# Patient Record
Sex: Male | Born: 1960 | Race: White | Hispanic: No | Marital: Married | Smoking: Never smoker
Health system: Southern US, Community
[De-identification: ages and names within clinical notes are randomized; demographics above are authoritative.]

## PROBLEM LIST (undated history)

## (undated) DIAGNOSIS — G4730 Sleep apnea, unspecified: Secondary | ICD-10-CM

## (undated) DIAGNOSIS — M1990 Unspecified osteoarthritis, unspecified site: Secondary | ICD-10-CM

## (undated) DIAGNOSIS — F32A Depression, unspecified: Secondary | ICD-10-CM

## (undated) DIAGNOSIS — C61 Malignant neoplasm of prostate: Secondary | ICD-10-CM

## (undated) DIAGNOSIS — N189 Chronic kidney disease, unspecified: Secondary | ICD-10-CM

## (undated) DIAGNOSIS — Z87442 Personal history of urinary calculi: Secondary | ICD-10-CM

## (undated) DIAGNOSIS — I4510 Unspecified right bundle-branch block: Secondary | ICD-10-CM

## (undated) DIAGNOSIS — K589 Irritable bowel syndrome without diarrhea: Secondary | ICD-10-CM

## (undated) DIAGNOSIS — F329 Major depressive disorder, single episode, unspecified: Secondary | ICD-10-CM

## (undated) DIAGNOSIS — I10 Essential (primary) hypertension: Secondary | ICD-10-CM

## (undated) DIAGNOSIS — K219 Gastro-esophageal reflux disease without esophagitis: Secondary | ICD-10-CM

## (undated) HISTORY — DX: Major depressive disorder, single episode, unspecified: F32.9

## (undated) HISTORY — DX: Chronic kidney disease, unspecified: N18.9

## (undated) HISTORY — DX: Unspecified right bundle-branch block: I45.10

## (undated) HISTORY — PX: SHOULDER ARTHROSCOPY: SHX128

## (undated) HISTORY — PX: APPENDECTOMY: SHX54

## (undated) HISTORY — PX: HERNIA REPAIR: SHX51

## (undated) HISTORY — PX: WISDOM TOOTH EXTRACTION: SHX21

## (undated) HISTORY — DX: Malignant neoplasm of prostate: C61

## (undated) HISTORY — DX: Depression, unspecified: F32.A

## (undated) HISTORY — DX: Essential (primary) hypertension: I10

---

## 1994-04-10 HISTORY — PX: INGUINAL HERNIA REPAIR: SUR1180

## 2011-04-11 HISTORY — PX: COLONOSCOPY: SHX174

## 2011-12-26 ENCOUNTER — Encounter: Payer: Self-pay | Admitting: Internal Medicine

## 2012-01-25 ENCOUNTER — Encounter: Payer: Self-pay | Admitting: Internal Medicine

## 2012-01-25 ENCOUNTER — Ambulatory Visit (AMBULATORY_SURGERY_CENTER): Payer: BC Managed Care – PPO | Admitting: *Deleted

## 2012-01-25 VITALS — Ht 73.0 in | Wt 241.2 lb

## 2012-01-25 DIAGNOSIS — Z1211 Encounter for screening for malignant neoplasm of colon: Secondary | ICD-10-CM

## 2012-01-25 MED ORDER — MOVIPREP 100 G PO SOLR: 1.0000 | Freq: Once | ORAL | Status: DC

## 2012-01-25 NOTE — Progress Notes (Signed)
Pt had a previous colonoscopy in [place] in 2004 that was normal except internal hemorrhoids. ewm

## 2012-02-15 ENCOUNTER — Encounter: Payer: Self-pay | Admitting: Internal Medicine

## 2012-02-15 ENCOUNTER — Ambulatory Visit (AMBULATORY_SURGERY_CENTER): Payer: BC Managed Care – PPO | Admitting: Internal Medicine

## 2012-02-15 VITALS — BP 117/70 | HR 66 | Temp 98.5°F | Resp 20 | Ht 73.0 in | Wt 241.0 lb

## 2012-02-15 DIAGNOSIS — Z1211 Encounter for screening for malignant neoplasm of colon: Secondary | ICD-10-CM

## 2012-02-15 MED ORDER — SODIUM CHLORIDE 0.9 % IV SOLN: 500.0000 mL | INTRAVENOUS | Status: DC

## 2012-02-15 NOTE — Op Note (Signed)
[facility] [address] [place], [address]   COLONOSCOPY PROCEDURE REPORT  PATIENT: [name], Brandon Franco  MR#: [id number] BIRTHDATE: 04/18/1960 , 51  yrs. old GENDER: Male ENDOSCOPIST: Roxy Cedar, MD REFERRED XB:[id number] Tisovec, M.D. PROCEDURE DATE:  02/15/2012 PROCEDURE:   Colonoscopy, screening    (negative colon in [place] (reviewed) to evaluate IBS type symptoms) ASA CLASS:   Class II INDICATIONS:average risk patient for colon cancer. MEDICATIONS: MAC sedation, administered by CRNA and propofol (Diprivan) 300mg  IV  DESCRIPTION OF PROCEDURE:   After the risks benefits and alternatives of the procedure were thoroughly explained, informed consent was obtained.  A digital rectal exam revealed no abnormalities of the rectum.   The LB CF-[id number]  endoscope was introduced through the anus and advanced to the cecum, which was identified by both the appendix and ileocecal valve. No adverse events experienced.   The quality of the prep was excellent, using MoviPrep  The instrument was then slowly withdrawn as the colon was fully examined.      COLON FINDINGS: Mild diverticulosis was noted in the sigmoid colon. Small nonbleeding cecal AVM.   The colon was otherwise normal. There was no  inflammation, polyps or cancers .  Retroflexed views revealed no abnormalities. The time to cecum=2 minutes 56 seconds. Withdrawal time=11 minutes 30 seconds.  The scope was withdrawn and the procedure completed. COMPLICATIONS: There were no complications.  ENDOSCOPIC IMPRESSION: 1.   Mild diverticulosis was noted in the sigmoid colon and incidental small cecal AVM 2.   The colon was otherwise normal... No polyps  RECOMMENDATIONS: 1. Continue current colorectal screening recommendations for "routine risk" patients with a repeat colonoscopy in 10 years.   eSigned:  Roxy Cedar, MD 02/15/2012 9:47 AM   cc: Guerry Bruin, MD and The Patient

## 2012-02-15 NOTE — Patient Instructions (Addendum)

## 2012-02-15 NOTE — Progress Notes (Signed)
Patient did not experience any of the following events: a burn prior to discharge; a fall within the facility; wrong site/side/patient/procedure/implant event; or a hospital transfer or hospital admission upon discharge from the facility. (G8907) Patient did not have preoperative order for IV antibiotic SSI prophylaxis. (G8918)  

## 2012-02-16 ENCOUNTER — Telehealth: Payer: Self-pay | Admitting: *Deleted

## 2012-02-16 NOTE — Telephone Encounter (Signed)
  Follow up Call-  Call back number 02/15/2012  Post procedure Call Back phone  # [phone]  Permission to leave phone message Yes     Patient questions:  Do you have a fever, pain , or abdominal swelling? no Pain Score  0 *  Have you tolerated food without any problems? yes  Have you been able to return to your normal activities? yes  Do you have any questions about your discharge instructions: Diet   no Medications  no Follow up visit  no  Do you have questions or concerns about your Care? no  Actions:2 * If pain score is 4 or above: No action needed, pain <4.

## 2012-04-11 ENCOUNTER — Other Ambulatory Visit (HOSPITAL_COMMUNITY): Payer: Self-pay | Admitting: Orthopaedic Surgery

## 2012-04-19 ENCOUNTER — Encounter (HOSPITAL_COMMUNITY): Payer: Self-pay | Admitting: Pharmacy Technician

## 2012-04-22 ENCOUNTER — Ambulatory Visit (HOSPITAL_COMMUNITY)
Admission: RE | Admit: 2012-04-22 | Discharge: 2012-04-22 | Disposition: A | Discharge status: Home / Self Care | Payer: BC Managed Care – PPO | Source: Ambulatory Visit | Attending: Orthopaedic Surgery | Admitting: Orthopaedic Surgery

## 2012-04-22 ENCOUNTER — Encounter (HOSPITAL_COMMUNITY)
Admission: RE | Admit: 2012-04-22 | Discharge: 2012-04-22 | Disposition: A | Discharge status: Home / Self Care | Payer: BC Managed Care – PPO | Source: Ambulatory Visit | Attending: Orthopaedic Surgery | Admitting: Orthopaedic Surgery

## 2012-04-22 ENCOUNTER — Encounter (HOSPITAL_COMMUNITY): Payer: Self-pay

## 2012-04-22 DIAGNOSIS — I4510 Unspecified right bundle-branch block: Secondary | ICD-10-CM | POA: Insufficient documentation

## 2012-04-22 DIAGNOSIS — Z01818 Encounter for other preprocedural examination: Secondary | ICD-10-CM | POA: Insufficient documentation

## 2012-04-22 DIAGNOSIS — Z01810 Encounter for preprocedural cardiovascular examination: Secondary | ICD-10-CM | POA: Insufficient documentation

## 2012-04-22 DIAGNOSIS — Z01812 Encounter for preprocedural laboratory examination: Secondary | ICD-10-CM | POA: Insufficient documentation

## 2012-04-22 DIAGNOSIS — R9431 Abnormal electrocardiogram [ECG] [EKG]: Secondary | ICD-10-CM | POA: Insufficient documentation

## 2012-04-22 HISTORY — DX: Personal history of urinary calculi: Z87.442

## 2012-04-22 HISTORY — DX: Irritable bowel syndrome without diarrhea: K58.9

## 2012-04-22 HISTORY — DX: Unspecified osteoarthritis, unspecified site: M19.90

## 2012-04-22 LAB — URINALYSIS, ROUTINE W REFLEX MICROSCOPIC
Bilirubin Urine: NEGATIVE
Glucose, UA: NEGATIVE mg/dL
Ketones, ur: NEGATIVE mg/dL
pH: 6 (ref 5.0–8.0)

## 2012-04-22 LAB — CBC
Hemoglobin: 16.5 g/dL (ref 13.0–17.0)
MCH: 31.4 pg (ref 26.0–34.0)
MCV: 88.4 fL (ref 78.0–100.0)
RBC: 5.25 MIL/uL (ref 4.22–5.81)

## 2012-04-22 LAB — SURGICAL PCR SCREEN: MRSA, PCR: NEGATIVE

## 2012-04-22 LAB — BASIC METABOLIC PANEL
CO2: 28 mEq/L (ref 19–32)
Calcium: 10 mg/dL (ref 8.4–10.5)
Glucose, Bld: 90 mg/dL (ref 70–99)
Potassium: 3.9 mEq/L (ref 3.5–5.1)
Sodium: 137 mEq/L (ref 135–145)

## 2012-04-22 LAB — PROTIME-INR
INR: 0.92 (ref 0.00–1.49)
Prothrombin Time: 12.3 seconds (ref 11.6–15.2)

## 2012-04-22 NOTE — Patient Instructions (Addendum)
Brandon Franco  04/22/2012                           YOUR PROCEDURE IS SCHEDULED ON:  04/26/12                PLEASE REPORT TO SHORT STAY CENTER AT :  12:15 pm               CALL THIS NUMBER IF ANY PROBLEMS THE DAY OF SURGERY :               [phone]                      REMEMBER:   Do not eat food or drink liquids AFTER MIDNIGHT  May have clear liquids UNTIL 6 HOURS BEFORE SURGERY 8:45 AM  Clear liquids include soda, tea, black coffee, apple or grape juice, broth.  Take these medicines the morning of surgery with A SIP OF WATER: PAXIL   Do not wear jewelry, make-up   Do not wear lotions, powders, or perfumes.   Do not shave legs or underarms 12 hrs. before surgery (men may shave face)  Do not bring valuables to the hospital.  Contacts, dentures or bridgework may not be worn into surgery.  Leave suitcase in the car. After surgery it may be brought to your room.  For patients admitted to the hospital more than one night, checkout time is 11:00                          The day of discharge.   Patients discharged the day of surgery will not be allowed to drive home                             If going home same day of surgery, must have someone stay with you first                           24 hrs at home and arrange for some one to drive you home from hospital.    Special Instructions:   Please read over the following fact sheets that you were given:               1. MRSA  INFORMATION                      2. [place] PREPARING FOR SURGERY SHEET                                                X_____________________________________________________________________        Failure to follow these instructions may result in cancellation of your surgery

## 2012-04-22 NOTE — Progress Notes (Signed)
Abnormal PTT faxed to Dr. Maureen Ralphs - confirmation recieved

## 2012-04-22 NOTE — Progress Notes (Signed)
04/22/12 0816  OBSTRUCTIVE SLEEP APNEA  Do you snore loudly (loud enough to be heard through closed doors)?  1  Do you often feel tired, fatigued, or sleepy during the daytime? 0  Has anyone observed you stop breathing during your sleep? 0  Do you have, or are you being treated for high blood pressure? 1  BMI more than 35 kg/m2? 0  Age over 51 years old? 1  Neck circumference greater than 40 cm/18 inches? 0  Gender: 1  Obstructive Sleep Apnea Score 4   Score 4 or greater  Results sent to PCP

## 2012-04-26 ENCOUNTER — Inpatient Hospital Stay (HOSPITAL_COMMUNITY)
Admission: RE | Admit: 2012-04-26 | Discharge: 2012-04-29 | DRG: 818 | Disposition: A | Discharge status: Home / Self Care | Payer: BC Managed Care – PPO | Source: Ambulatory Visit | Attending: Orthopaedic Surgery | Admitting: Orthopaedic Surgery

## 2012-04-26 ENCOUNTER — Encounter (HOSPITAL_COMMUNITY)
Admission: RE | Disposition: A | Discharge status: Home / Self Care | Payer: Self-pay | Source: Ambulatory Visit | Attending: Orthopaedic Surgery

## 2012-04-26 ENCOUNTER — Ambulatory Visit (HOSPITAL_COMMUNITY): Payer: BC Managed Care – PPO | Admitting: Anesthesiology

## 2012-04-26 ENCOUNTER — Ambulatory Visit (HOSPITAL_COMMUNITY): Payer: BC Managed Care – PPO

## 2012-04-26 ENCOUNTER — Encounter (HOSPITAL_COMMUNITY): Payer: Self-pay | Admitting: Orthopedic Surgery

## 2012-04-26 ENCOUNTER — Encounter (HOSPITAL_COMMUNITY): Payer: Self-pay | Admitting: Anesthesiology

## 2012-04-26 ENCOUNTER — Encounter (HOSPITAL_COMMUNITY): Payer: Self-pay | Admitting: *Deleted

## 2012-04-26 DIAGNOSIS — R29898 Other symptoms and signs involving the musculoskeletal system: Secondary | ICD-10-CM | POA: Diagnosis present

## 2012-04-26 DIAGNOSIS — F329 Major depressive disorder, single episode, unspecified: Secondary | ICD-10-CM | POA: Diagnosis present

## 2012-04-26 DIAGNOSIS — Z8719 Personal history of other diseases of the digestive system: Secondary | ICD-10-CM

## 2012-04-26 DIAGNOSIS — N189 Chronic kidney disease, unspecified: Secondary | ICD-10-CM | POA: Diagnosis present

## 2012-04-26 DIAGNOSIS — F3289 Other specified depressive episodes: Secondary | ICD-10-CM | POA: Diagnosis present

## 2012-04-26 DIAGNOSIS — I129 Hypertensive chronic kidney disease with stage 1 through stage 4 chronic kidney disease, or unspecified chronic kidney disease: Secondary | ICD-10-CM | POA: Diagnosis present

## 2012-04-26 DIAGNOSIS — M1610 Unilateral primary osteoarthritis, unspecified hip: Principal | ICD-10-CM | POA: Diagnosis present

## 2012-04-26 DIAGNOSIS — M169 Osteoarthritis of hip, unspecified: Secondary | ICD-10-CM

## 2012-04-26 DIAGNOSIS — I4510 Unspecified right bundle-branch block: Secondary | ICD-10-CM | POA: Diagnosis present

## 2012-04-26 DIAGNOSIS — Z9089 Acquired absence of other organs: Secondary | ICD-10-CM

## 2012-04-26 DIAGNOSIS — Z87442 Personal history of urinary calculi: Secondary | ICD-10-CM

## 2012-04-26 DIAGNOSIS — Z23 Encounter for immunization: Secondary | ICD-10-CM

## 2012-04-26 HISTORY — PX: TOTAL HIP ARTHROPLASTY: SHX124

## 2012-04-26 LAB — TYPE AND SCREEN

## 2012-04-26 SURGERY — ARTHROPLASTY, HIP, TOTAL, ANTERIOR APPROACH
Anesthesia: Spinal | Site: Hip | Laterality: Right | Wound class: Clean

## 2012-04-26 MED ORDER — OXYCODONE HCL 5 MG PO TABS: 5.0000 mg | ORAL_TABLET | Freq: Once | ORAL | Status: DC | PRN

## 2012-04-26 MED ORDER — PHENYLEPHRINE HCL 10 MG/ML IJ SOLN
INTRAMUSCULAR | Status: DC | PRN
  Administered 2012-04-26: 80 ug via INTRAVENOUS

## 2012-04-26 MED ORDER — OXYCODONE HCL ER 20 MG PO T12A
20.0000 mg | EXTENDED_RELEASE_TABLET | Freq: Two times a day (BID) | ORAL | Status: DC
  Administered 2012-04-26 – 2012-04-28 (×5): 20 mg via ORAL
  Filled 2012-04-26 (×5): qty 1

## 2012-04-26 MED ORDER — CEFAZOLIN SODIUM 1-5 GM-% IV SOLN
1.0000 g | Freq: Four times a day (QID) | INTRAVENOUS | Status: AC
  Administered 2012-04-26 – 2012-04-27 (×2): 1 g via INTRAVENOUS
  Filled 2012-04-26 (×2): qty 50

## 2012-04-26 MED ORDER — OXYCODONE HCL 5 MG/5ML PO SOLN
5.0000 mg | Freq: Once | ORAL | Status: DC | PRN
  Filled 2012-04-26: qty 5

## 2012-04-26 MED ORDER — ACETAMINOPHEN 10 MG/ML IV SOLN
INTRAVENOUS | Status: DC | PRN
  Administered 2012-04-26: 1000 mg via INTRAVENOUS

## 2012-04-26 MED ORDER — ALUM & MAG HYDROXIDE-SIMETH 200-200-20 MG/5ML PO SUSP: 30.0000 mL | ORAL | Status: DC | PRN

## 2012-04-26 MED ORDER — LIDOCAINE HCL (CARDIAC) 20 MG/ML IV SOLN
INTRAVENOUS | Status: DC | PRN
  Administered 2012-04-26: 100 mg via INTRAVENOUS

## 2012-04-26 MED ORDER — SODIUM CHLORIDE 0.9 % IV SOLN
INTRAVENOUS | Status: DC | PRN
  Administered 2012-04-26: 1000 mL via INTRAMUSCULAR

## 2012-04-26 MED ORDER — ASPIRIN EC 325 MG PO TBEC
325.0000 mg | DELAYED_RELEASE_TABLET | Freq: Two times a day (BID) | ORAL | Status: DC
  Administered 2012-04-27 – 2012-04-29 (×5): 325 mg via ORAL
  Filled 2012-04-26 (×9): qty 1

## 2012-04-26 MED ORDER — METOCLOPRAMIDE HCL 5 MG/ML IJ SOLN: 5.0000 mg | Freq: Three times a day (TID) | INTRAMUSCULAR | Status: DC | PRN

## 2012-04-26 MED ORDER — HYDROMORPHONE HCL PF 1 MG/ML IJ SOLN: 0.2500 mg | INTRAMUSCULAR | Status: DC | PRN

## 2012-04-26 MED ORDER — PROMETHAZINE HCL 25 MG/ML IJ SOLN: 6.2500 mg | INTRAMUSCULAR | Status: DC | PRN

## 2012-04-26 MED ORDER — ONDANSETRON HCL 4 MG PO TABS: 4.0000 mg | ORAL_TABLET | Freq: Four times a day (QID) | ORAL | Status: DC | PRN

## 2012-04-26 MED ORDER — DIPHENHYDRAMINE HCL 12.5 MG/5ML PO ELIX: 12.5000 mg | ORAL_SOLUTION | ORAL | Status: DC | PRN

## 2012-04-26 MED ORDER — LACTATED RINGERS IV SOLN
INTRAVENOUS | Status: DC
  Administered 2012-04-26: 1000 mL via INTRAVENOUS

## 2012-04-26 MED ORDER — CEFAZOLIN SODIUM-DEXTROSE 2-3 GM-% IV SOLR
INTRAVENOUS | Status: AC
  Filled 2012-04-26: qty 50

## 2012-04-26 MED ORDER — ACETAMINOPHEN 10 MG/ML IV SOLN: 1000.0000 mg | Freq: Once | INTRAVENOUS | Status: DC | PRN

## 2012-04-26 MED ORDER — MIDAZOLAM HCL 5 MG/5ML IJ SOLN
INTRAMUSCULAR | Status: DC | PRN
  Administered 2012-04-26: 2 mg via INTRAVENOUS

## 2012-04-26 MED ORDER — ONDANSETRON HCL 4 MG/2ML IJ SOLN
INTRAMUSCULAR | Status: DC | PRN
  Administered 2012-04-26: 4 mg via INTRAVENOUS

## 2012-04-26 MED ORDER — ZOLPIDEM TARTRATE 5 MG PO TABS: 5.0000 mg | ORAL_TABLET | Freq: Every evening | ORAL | Status: DC | PRN

## 2012-04-26 MED ORDER — ONDANSETRON HCL 4 MG/2ML IJ SOLN: 4.0000 mg | Freq: Four times a day (QID) | INTRAMUSCULAR | Status: DC | PRN

## 2012-04-26 MED ORDER — HYDROMORPHONE HCL PF 1 MG/ML IJ SOLN: 1.0000 mg | INTRAMUSCULAR | Status: DC | PRN

## 2012-04-26 MED ORDER — METHOCARBAMOL 100 MG/ML IJ SOLN
500.0000 mg | Freq: Four times a day (QID) | INTRAVENOUS | Status: DC | PRN
  Administered 2012-04-26: 500 mg via INTRAVENOUS
  Filled 2012-04-26: qty 5

## 2012-04-26 MED ORDER — MENTHOL 3 MG MT LOZG: 1.0000 | LOZENGE | OROMUCOSAL | Status: DC | PRN

## 2012-04-26 MED ORDER — FERROUS SULFATE 325 (65 FE) MG PO TABS
325.0000 mg | ORAL_TABLET | Freq: Three times a day (TID) | ORAL | Status: DC
  Administered 2012-04-26 – 2012-04-29 (×8): 325 mg via ORAL
  Filled 2012-04-26 (×12): qty 1

## 2012-04-26 MED ORDER — ACETAMINOPHEN 10 MG/ML IV SOLN
INTRAVENOUS | Status: AC
  Filled 2012-04-26: qty 100

## 2012-04-26 MED ORDER — SODIUM CHLORIDE 0.9 % IV SOLN
INTRAVENOUS | Status: DC
  Administered 2012-04-26: 75 mL/h via INTRAVENOUS
  Administered 2012-04-27: 11:00:00 via INTRAVENOUS

## 2012-04-26 MED ORDER — PROPOFOL 10 MG/ML IV EMUL
INTRAVENOUS | Status: DC | PRN
  Administered 2012-04-26: 120 ug/kg/min via INTRAVENOUS

## 2012-04-26 MED ORDER — ACETAMINOPHEN 325 MG PO TABS: 650.0000 mg | ORAL_TABLET | Freq: Four times a day (QID) | ORAL | Status: DC | PRN

## 2012-04-26 MED ORDER — PHENOL 1.4 % MT LIQD: 1.0000 | OROMUCOSAL | Status: DC | PRN

## 2012-04-26 MED ORDER — METHOCARBAMOL 500 MG PO TABS
500.0000 mg | ORAL_TABLET | Freq: Four times a day (QID) | ORAL | Status: DC | PRN
  Administered 2012-04-27 – 2012-04-28 (×3): 500 mg via ORAL
  Filled 2012-04-26 (×3): qty 1

## 2012-04-26 MED ORDER — CEFAZOLIN SODIUM-DEXTROSE 2-3 GM-% IV SOLR
2.0000 g | INTRAVENOUS | Status: AC
  Administered 2012-04-26: 2 g via INTRAVENOUS

## 2012-04-26 MED ORDER — OXYCODONE HCL 5 MG PO TABS
5.0000 mg | ORAL_TABLET | ORAL | Status: DC | PRN
  Administered 2012-04-26 – 2012-04-27 (×6): 10 mg via ORAL
  Filled 2012-04-26 (×6): qty 2

## 2012-04-26 MED ORDER — METOCLOPRAMIDE HCL 10 MG PO TABS: 5.0000 mg | ORAL_TABLET | Freq: Three times a day (TID) | ORAL | Status: DC | PRN

## 2012-04-26 MED ORDER — BUPIVACAINE IN DEXTROSE 0.75-8.25 % IT SOLN
INTRATHECAL | Status: DC | PRN
  Administered 2012-04-26: 3 mL via INTRATHECAL

## 2012-04-26 MED ORDER — DOCUSATE SODIUM 100 MG PO CAPS
100.0000 mg | ORAL_CAPSULE | Freq: Two times a day (BID) | ORAL | Status: DC
  Administered 2012-04-26 – 2012-04-28 (×5): 100 mg via ORAL

## 2012-04-26 MED ORDER — ACETAMINOPHEN 650 MG RE SUPP: 650.0000 mg | Freq: Four times a day (QID) | RECTAL | Status: DC | PRN

## 2012-04-26 MED ORDER — MEPERIDINE HCL 50 MG/ML IJ SOLN: 6.2500 mg | INTRAMUSCULAR | Status: DC | PRN

## 2012-04-26 MED ORDER — PAROXETINE HCL 10 MG PO TABS
10.0000 mg | ORAL_TABLET | Freq: Every morning | ORAL | Status: DC
  Administered 2012-04-27 – 2012-04-28 (×2): 10 mg via ORAL
  Filled 2012-04-26 (×3): qty 1

## 2012-04-26 SURGICAL SUPPLY — 36 items
BAG ZIPLOCK 12X15 (MISCELLANEOUS) ×4 IMPLANT
BLADE SAW SGTL 18X1.27X75 (BLADE) ×2 IMPLANT
CLOTH BEACON ORANGE TIMEOUT ST (SAFETY) ×2 IMPLANT
DERMABOND ADVANCED (GAUZE/BANDAGES/DRESSINGS) ×1
DERMABOND ADVANCED .7 DNX12 (GAUZE/BANDAGES/DRESSINGS) ×1 IMPLANT
DRAPE C-ARM 42X72 X-RAY (DRAPES) ×2 IMPLANT
DRAPE STERI IOBAN 125X83 (DRAPES) ×2 IMPLANT
DRAPE U-SHAPE 47X51 STRL (DRAPES) ×6 IMPLANT
DRSG AQUACEL AG ADV 3.5X10 (GAUZE/BANDAGES/DRESSINGS) ×2 IMPLANT
DURAPREP 26ML APPLICATOR (WOUND CARE) ×2 IMPLANT
ELECT BLADE TIP CTD 4 INCH (ELECTRODE) ×2 IMPLANT
ELECT REM PT RETURN 9FT ADLT (ELECTROSURGICAL) ×2
ELECTRODE REM PT RTRN 9FT ADLT (ELECTROSURGICAL) ×1 IMPLANT
FACESHIELD LNG OPTICON STERILE (SAFETY) ×8 IMPLANT
GLOVE BIO SURGEON STRL SZ7 (GLOVE) ×2 IMPLANT
GLOVE BIO SURGEON STRL SZ7.5 (GLOVE) ×2 IMPLANT
GLOVE BIOGEL PI IND STRL 7.5 (GLOVE) ×1 IMPLANT
GLOVE BIOGEL PI IND STRL 8 (GLOVE) ×1 IMPLANT
GLOVE BIOGEL PI INDICATOR 7.5 (GLOVE) ×1
GLOVE BIOGEL PI INDICATOR 8 (GLOVE) ×1
GLOVE ECLIPSE 7.0 STRL STRAW (GLOVE) ×2 IMPLANT
GOWN STRL REIN XL XLG (GOWN DISPOSABLE) ×4 IMPLANT
HANDPIECE INTERPULSE COAX TIP (DISPOSABLE) ×1
KIT BASIN OR (CUSTOM PROCEDURE TRAY) ×2 IMPLANT
PACK TOTAL JOINT (CUSTOM PROCEDURE TRAY) ×2 IMPLANT
PADDING CAST COTTON 6X4 STRL (CAST SUPPLIES) ×2 IMPLANT
SET HNDPC FAN SPRY TIP SCT (DISPOSABLE) ×1 IMPLANT
SUT ETHIBOND NAB CT1 #1 30IN (SUTURE) ×4 IMPLANT
SUT MNCRL AB 4-0 PS2 18 (SUTURE) ×2 IMPLANT
SUT VIC AB 1 CT1 36 (SUTURE) ×4 IMPLANT
SUT VIC AB 2-0 CT1 27 (SUTURE) ×3
SUT VIC AB 2-0 CT1 TAPERPNT 27 (SUTURE) ×3 IMPLANT
SUT VLOC 180 0 24IN GS25 (SUTURE) ×2 IMPLANT
TOWEL OR 17X26 10 PK STRL BLUE (TOWEL DISPOSABLE) ×4 IMPLANT
TOWEL OR NON WOVEN STRL DISP B (DISPOSABLE) ×2 IMPLANT
TRAY FOLEY CATH 14FRSI W/METER (CATHETERS) ×2 IMPLANT

## 2012-04-26 NOTE — H&P (Signed)
TOTAL HIP ADMISSION H&P  Patient is admitted for right total hip arthroplasty.  Subjective:  Chief Complaint: right hip pain  HPI: Brandon Franco, 52 y.o. male, has a history of pain and functional disability in the right hip(s) due to arthritis and patient has failed non-surgical conservative treatments for greater than 12 weeks to include NSAID's and/or analgesics, corticosteriod injections, weight reduction as appropriate and activity modification.  Onset of symptoms was gradual starting 5 years ago with gradually worsening course since that time.The patient noted no past surgery on the right hip(s).  Patient currently rates pain in the right hip at 7 out of 10 with activity. Patient has night pain, worsening of pain with activity and weight bearing, trendelenberg gait, pain that interfers with activities of daily living and pain with passive range of motion. Patient has evidence of subchondral sclerosis, joint space narrowing, osteophytes. by imaging studies. This condition presents safety issues increasing the risk of falls.  There is no current active infection.  Patient Active Problem List   Diagnosis Date Noted  . Degenerative arthritis of hip 04/26/2012   Past Medical History  Diagnosis Date  . Depression   . Hypertension   . Right bundle branch block   . Chronic kidney disease     kidney stone  . Arthritis   . IBS (irritable bowel syndrome)   . History of kidney stones     Past Surgical History  Procedure Date  . Appendectomy   . Hernia repair   . Colonoscopy     No prescriptions prior to admission   No Known Allergies  History  Substance Use Topics  . Smoking status: Never Smoker   . Smokeless tobacco: Never Used  . Alcohol Use: 0.6 oz/week    1 Shots of liquor per week     Comment: socially- occasional    Family History  Problem Relation Age of Onset  . Colon cancer Paternal Uncle     passed away age 77  . Heart disease Mother   . Hypertension Father       Review of Systems  Musculoskeletal: Positive for joint pain.  All other systems reviewed and are negative.    Objective:  Physical Exam  Constitutional: He is oriented to person, place, and time. He appears well-developed and well-nourished.  HENT:  Head: Normocephalic and atraumatic.  Eyes: EOM are normal. Pupils are equal, round, and reactive to light.  Neck: Normal range of motion. Neck supple.  Cardiovascular: Normal rate and regular rhythm.   Respiratory: Effort normal and breath sounds normal.  GI: Soft. Bowel sounds are normal.  Musculoskeletal:       Right hip: He exhibits decreased range of motion, bony tenderness and crepitus.  Neurological: He is alert and oriented to person, place, and time.  Skin: Skin is warm and dry.  Psychiatric: He has a normal mood and affect.    Vital signs in last 24 hours:    Labs:   Estimated Body mass index is 31.80 kg/(m^2) as calculated from the following:   Height as of 02/15/12: 6\' 1" (1.854 m).   Weight as of 02/15/12: 241 lb(109.317 kg).   Imaging Review Plain radiographs demonstrate moderate degenerative joint disease of the right hip(s). The bone quality appears to be excellent for age and reported activity level.  Assessment/Plan:  End stage arthritis, right hip(s)  The patient history, physical examination, clinical judgement of the provider and imaging studies are consistent with end stage degenerative joint disease of the right  hip(s) and total hip arthroplasty is deemed medically necessary. The treatment options including medical management, injection therapy, arthroscopy and arthroplasty were discussed at length. The risks and benefits of total hip arthroplasty were presented and reviewed. The risks due to aseptic loosening, infection, stiffness, dislocation/subluxation,  thromboembolic complications and other imponderables were discussed.  The patient acknowledged the explanation, agreed to proceed with the plan and  consent was signed. Patient is being admitted for inpatient treatment for surgery, pain control, PT, OT, prophylactic antibiotics, VTE prophylaxis, progressive ambulation and ADL's and discharge planning.The patient is planning to be discharged home with home health services

## 2012-04-26 NOTE — Transfer of Care (Signed)
Immediate Anesthesia Transfer of Care Note  Patient: Brandon Franco  Procedure(s) Performed: Procedure(s) (LRB): TOTAL HIP ARTHROPLASTY ANTERIOR APPROACH (Right)  Patient Location: PACU  Anesthesia Type: Spinal  Level of Consciousness: sedated, patient cooperative and responds to stimulaton  Airway & Oxygen Therapy: Patient Spontanous Breathing and Patient connected to face mask oxgen  Post-op Assessment: Report given to PACU RN and Post -op Vital signs reviewed and stable  Post vital signs: Reviewed and stable  Complications: No apparent anesthesia complications

## 2012-04-26 NOTE — Anesthesia Preprocedure Evaluation (Addendum)
Anesthesia Evaluation  Patient identified by MRN, date of birth, ID band Patient awake    Reviewed: Allergy & Precautions, H&P , NPO status , Patient's Chart, lab work & pertinent test results  Airway Mallampati: I TM Distance: >3 FB Neck ROM: Full    Dental  (+) Dental Advisory Given and Teeth Intact   Pulmonary neg pulmonary ROS,  breath sounds clear to auscultation  Pulmonary exam normal       Cardiovascular hypertension, Pt. on medications + dysrhythmias Rhythm:Regular Rate:Normal     Neuro/Psych PSYCHIATRIC DISORDERS Depression negative neurological ROS     GI/Hepatic negative GI ROS, Neg liver ROS,   Endo/Other    Renal/GU Renal disease     Musculoskeletal  (+) Arthritis -, Osteoarthritis,    Abdominal   Peds  Hematology negative hematology ROS (+)   Anesthesia Other Findings   Reproductive/Obstetrics                        Anesthesia Physical Anesthesia Plan  ASA: II  Anesthesia Plan: Spinal   Post-op Pain Management:    Induction:   Airway Management Planned: Simple Face Mask  Additional Equipment:   Intra-op Plan:   Post-operative Plan:   Informed Consent: I have reviewed the patients History and Physical, chart, labs and discussed the procedure including the risks, benefits and alternatives for the proposed anesthesia with the patient or authorized representative who has indicated his/her understanding and acceptance.   Dental advisory given  Plan Discussed with: CRNA  Anesthesia Plan Comments:         Anesthesia Quick Evaluation

## 2012-04-26 NOTE — Anesthesia Procedure Notes (Signed)
Spinal  Patient location during procedure: OR Start time: 04/26/2012 3:03 PM End time: 04/26/2012 3:06 PM Staffing Anesthesiologist: Lewie Loron R Performed by: anesthesiologist  Preanesthetic Checklist Completed: patient identified, site marked, surgical consent, pre-op evaluation, timeout performed, IV checked, risks and benefits discussed and monitors and equipment checked Spinal Block Patient position: sitting Prep: ChloraPrep Patient monitoring: heart rate, continuous pulse ox and blood pressure Approach: midline Location: L3-4 Injection technique: single-shot Needle Needle type: Sprotte  Needle gauge: 24 G Needle length: 9 cm Assessment Sensory level: T8 Additional Notes Expiration date of kit checked and confirmed. Patient tolerated procedure well, without complications.

## 2012-04-26 NOTE — Brief Op Note (Signed)
04/26/2012  4:51 PM  PATIENT:  Brandon Franco  52 y.o. male  PRE-OPERATIVE DIAGNOSIS:  Right hip arthritis  POST-OPERATIVE DIAGNOSIS:  Right hip arthritis  PROCEDURE:  Procedure(s) (LRB) with comments: TOTAL HIP ARTHROPLASTY ANTERIOR APPROACH (Right) - Right Total Hip Arthroplasty  SURGEON:  Surgeon(s) and Role:    * Kathryne Hitch, MD - Primary  PHYSICIAN ASSISTANT:   ASSISTANTS: Maud Deed, PA-C   ANESTHESIA:   spinal  EBL:  Total I/O In: 2000 [I.V.:2000] Out: 1200 [Urine:300; Blood:900]  BLOOD ADMINISTERED:none  DRAINS: none   LOCAL MEDICATIONS USED:  NONE  SPECIMEN:  No Specimen  DISPOSITION OF SPECIMEN:  N/A  COUNTS:  YES  TOURNIQUET:  * No tourniquets in log *  DICTATION: .Other Dictation: Dictation Number [id number]  PLAN OF CARE: Admit to inpatient   PATIENT DISPOSITION:  PACU - hemodynamically stable.   Delay start of Pharmacological VTE agent (>24hrs) due to surgical blood loss or risk of bleeding: no

## 2012-04-26 NOTE — Anesthesia Postprocedure Evaluation (Signed)
Anesthesia Post Note  Patient: Brandon Franco  Procedure(s) Performed: Procedure(s) (LRB): TOTAL HIP ARTHROPLASTY ANTERIOR APPROACH (Right)  Anesthesia type: Spinal  Patient location: PACU  Post pain: Pain level controlled  Post assessment: Post-op Vital signs reviewed  Last Vitals: BP 111/68  Pulse 62  Temp 36.4 C (Oral)  Resp 12  Ht 6\' 1"  (1.854 m)  Wt 235 lb (106.595 kg)  BMI 31.00 kg/m2  SpO2 100%  Post vital signs: Reviewed  Level of consciousness: sedated  Complications: No apparent anesthesia complications

## 2012-04-26 NOTE — Preoperative (Signed)
Beta Blockers   Reason not to administer Beta Blockers:Not Applicable 

## 2012-04-27 LAB — BASIC METABOLIC PANEL
Chloride: 101 mEq/L (ref 96–112)
Creatinine, Ser: 0.93 mg/dL (ref 0.50–1.35)
GFR calc Af Amer: >90 mL/min (ref >90)
Potassium: 3.7 mEq/L (ref 3.5–5.1)
Sodium: 136 mEq/L (ref 135–145)

## 2012-04-27 LAB — CBC
MCV: 89.4 fL (ref 78.0–100.0)
Platelets: 217 10*3/uL (ref 150–400)
RDW: 13 % (ref 11.5–15.5)
WBC: 9.5 10*3/uL (ref 4.0–10.5)

## 2012-04-27 NOTE — Progress Notes (Signed)
Subjective: Pt stable - feels good   Objective: Vital signs in last 24 hours: Temp:  [97.5 F (36.4 C)-98.5 F (36.9 C)] 98.5 F (36.9 C) (01/18 1002) Pulse Rate:  [55-81] 81  (01/18 1002) Resp:  [9-20] 16  (01/18 1002) BP: (94-137)/(55-87) 113/70 mmHg (01/18 1002) SpO2:  [98 %-100 %] 100 % (01/18 1002) Weight:  [106.595 kg (235 lb)] 106.595 kg (235 lb) (01/17 1759)  Intake/Output from previous day: 01/17 0701 - 01/18 0700 In: 3651.3 [P.O.:240; I.V.:3306.3; IV Piggyback:105] Out: 1875 [Urine:975; Blood:900] Intake/Output this shift: Total I/O In: 291.3 [P.O.:240; I.V.:51.3] Out: 275 [Urine:275]  Exam:  Neurovascular intact Sensation intact distally Intact pulses distally Dorsiflexion/Plantar flexion intact  Labs:  Basename 04/27/12 0425  HGB 12.5*    Basename 04/27/12 0425  WBC 9.5  RBC 4.14*  HCT 37.0*  PLT 217    Basename 04/27/12 0425  NA 136  K 3.7  CL 101  CO2 28  BUN 12  CREATININE 0.93  GLUCOSE 110*  CALCIUM 8.3*   No results found for this basename: LABPT:2,INR:2 in the last 72 hours  Assessment/Plan: Pt doing well - possible dc sun   DEAN,GREGORY SCOTT 04/27/2012, 10:31 AM

## 2012-04-27 NOTE — Evaluation (Signed)
Physical Therapy Evaluation Patient Details Name: Brandon Franco MRN: [id number] DOB: [birth year] Today's Date: 04/27/2012 Time: 4098-1191 PT Time Calculation (min): 25 min  PT Assessment / Plan / Recommendation Clinical Impression  Pt. is a 52 y.o. male admitted for right THA direct anterior approach and presents with decreased independence with mobility due to acute pain, decreased strength and AROM right LE, decreased activity tolerance and will benefit from skilled PT in the acute setting to maximze independence and allow d/c home with family assist and HHPT.    PT Assessment  Patient needs continued PT services    Follow Up Recommendations  Home health PT          Equipment Recommendations  Rolling walker with 5" wheels       Frequency 7X/week    Precautions / Restrictions Precautions Precautions:  (None) Restrictions Weight Bearing Restrictions: No RLE Weight Bearing: Weight bearing as tolerated   Pertinent Vitals/Pain 2/10 right hip      Mobility  Bed Mobility Bed Mobility: Supine to Sit;Sitting - Scoot to Edge of Bed Supine to Sit: 4: Min assist;HOB flat Sitting - Scoot to [place] of Bed: 5: Supervision Transfers Sit to Stand: 4: Min guard;With upper extremity assist;From bed Stand to Sit: 4: Min guard;With upper extremity assist;With armrests;To chair/3-in-1 Details for Transfer Assistance: VCs for safe hand placement and to back up so legs touch the surface he is getting ready to sit down on Ambulation/Gait Ambulation/Gait Assistance: 4: Min guard Ambulation Distance (Feet): 80 Feet Assistive device: Rolling walker Ambulation/Gait Assistance Details: cues for walker safety, sequence, etc. Gait Pattern: Step-to pattern       Exercises Total Joint Exercises Ankle Circles/Pumps: AROM;Both;5 reps;Supine   PT Diagnosis: Difficulty walking;Acute pain;Generalized weakness  PT Problem List: Decreased strength;Decreased range of motion;Decreased knowledge of use  of DME;Decreased activity tolerance;Pain;Decreased mobility PT Treatment Interventions: DME instruction;Gait training;Stair training;Functional mobility training;Therapeutic activities;Therapeutic exercise;Balance training;Patient/family education   PT Goals Acute Rehab PT Goals PT Goal Formulation: With patient Time For Goal Achievement: 04/30/12 Potential to Achieve Goals: Good Pt will go Supine/Side to Sit: with modified independence PT Goal: Supine/Side to Sit - Progress: Goal set today Pt will go Sit to Supine/Side: with modified independence PT Goal: Sit to Supine/Side - Progress: Goal set today Pt will go Sit to Stand: with modified independence PT Goal: Sit to Stand - Progress: Goal set today Pt will go Stand to Sit: with modified independence PT Goal: Stand to Sit - Progress: Goal set today Pt will Ambulate: >150 feet;with modified independence;with rolling walker PT Goal: Ambulate - Progress: Goal set today Pt will Go Up / Down Stairs: 3-5 stairs;with supervision;with rail(s) PT Goal: Up/Down Stairs - Progress: Goal set today Pt will Perform Home Exercise Program: Independently PT Goal: Perform Home Exercise Program - Progress: Goal set today  Visit Information  Last PT Received On: 04/27/12 Assistance Needed: +1    Subjective Data  Subjective: I dislocated my knee years ago and it is sore now. 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 Communication: No difficulties Dominant Hand: Right    Cognition  Overall Cognitive Status: Appears within functional limits for tasks assessed/performed Arousal/Alertness: Awake/alert Orientation Level: Appears intact for tasks assessed Behavior During Session: [facility] for tasks performed    Extremity/Trunk Assessment Right Lower Extremity Assessment RLE ROM/Strength/Tone: Deficits;Due to pain RLE ROM/Strength/Tone Deficits: ankle AROM WFL, knee extension AAROM WFL, strength 3-/5, otherwise not tested Left Lower Extremity Assessment LLE ROM/Strength/Tone: WFL for tasks assessed   Balance Balance Balance Assessed: Yes Static Standing Balance Static Standing - Balance Support: Bilateral upper extremity supported Static Standing - Level of Assistance: 5: Stand by assistance Static Standing - Comment/# of Minutes: stood few seconds prior to walking with supervision  End of Session PT - End of Session Equipment Utilized During Treatment: Gait belt Activity Tolerance: Patient tolerated treatment well Patient left: in chair;with call bell/phone within reach;with family/visitor present  GP     [facility] 04/27/2012, 10:56 AM  Sheran Lawless, PT [phone] 04/27/2012

## 2012-04-27 NOTE — Evaluation (Signed)
Occupational Therapy Evaluation and Discharge Patient Details Name: Brandon Franco MRN: [id number] DOB: 1960/09/16 Today's Date: 04/27/2012 Time: 4098-1191 OT Time Calculation (min): 40 min  OT Assessment / Plan / Recommendation Clinical Impression  This 52 yo s/p right hip direct anterior approach presents to acute OT with all education completed. Will D/C from acute OT.    OT Assessment  Patient does not need any further OT services    Follow Up Recommendations  No OT follow up       Equipment Recommendations  3 in 1 bedside comode          Precautions / Restrictions Precautions Precautions:  (None) Restrictions Weight Bearing Restrictions: No RLE Weight Bearing: Weight bearing as tolerated   Pertinent Vitals/Pain 2/10 right hip    ADL  Equipment Used: Rolling walker;Gait belt (3n1) Transfers/Ambulation Related to ADLs: Min guard A for all ADL Comments: Pt was able to do 3n1 transfer over toilet, shower stall transfer backing in and stepping out foreward, wife will A with LB ADLs until pt can do it for himself.      Visit Information  Last OT Received On: 04/27/12 Assistance Needed: +1 PT/OT Co-Evaluation/Treatment: Yes (partial)    Subjective Data  Subjective: It really doesn't hurt anymore to walk on it than it does when I am still   Prior Functioning     Home Living Lives With: Spouse Available Help at Discharge: Available 24 hours/day Type of Home: House Home Access: Stairs to enter [employer] of Steps: 6 Entrance Stairs-Rails: Left (wall on other side) Home Layout: One level Bathroom Shower/Tub: Walk-in shower;Door [employer] Toilet: Pharmacist, community: Yes How Accessible: Accessible via walker Home Adaptive Equipment: None Prior Function Level of Independence: Independent Able to Take Stairs?: Yes Driving: Yes 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: No difficulties Dominant  Hand: Right            Cognition  Overall Cognitive Status: Appears within functional limits for tasks assessed/performed Arousal/Alertness: Awake/alert Orientation Level: Appears intact for tasks assessed Behavior During Session: [facility] for tasks performed    Extremity/Trunk Assessment Right Upper Extremity Assessment RUE ROM/Strength/Tone: Within functional levels     Mobility Bed Mobility Bed Mobility: Supine to Sit;Sitting - Scoot to Edge of Bed Supine to Sit: 4: Min assist;HOB flat Sitting - Scoot to [place] of Bed: 5: Supervision Transfers Transfers: Sit to Stand;Stand to Sit Sit to Stand: 4: Min guard;With upper extremity assist;From bed Stand to Sit: 4: Min guard;With upper extremity assist;With armrests;To chair/3-in-1 Details for Transfer Assistance: VCs for safe hand placement and to back up so legs touch the surface he is getting ready to sit down on              End of Session OT - End of Session Equipment Utilized During Treatment: Gait belt (RW) Activity Tolerance: Patient tolerated treatment well Patient left: in chair;with call bell/phone within reach;with family/visitor present       Evette Georges [phone] 04/27/2012, 9:44 AM

## 2012-04-27 NOTE — Progress Notes (Signed)
Physical Therapy Treatment Patient Details Name: Brandon Franco MRN: [id number] DOB: 02-08-1961 Today's Date: 04/27/2012 Time: 4098-1191 PT Time Calculation (min): 11 min  PT Assessment / Plan / Recommendation Comments on Treatment Session  Patient doing well and ambulating with nursing staff.  Had knee pain and instructed in AROM exercises to improve mobility and edema.  Will practice steps in am prior to D/C.    Follow Up Recommendations  Home health PT           Equipment Recommendations  Rolling walker with 5" wheels       Frequency 7X/week   Plan Discharge plan remains appropriate    Precautions / Restrictions Precautions Precautions: None Restrictions RLE Weight Bearing: Weight bearing as tolerated   Pertinent Vitals/Pain Minimal c/o pain; denies need for any medication    Mobility  Bed Mobility Details for Bed Mobility Assistance: Not performed, patient walked earlier with nursing staff    Exercises Total Joint Exercises Ankle Circles/Pumps: AROM;Both;10 reps;Supine Quad Sets: AROM;Right;10 reps;Supine Short Arc Quad: AROM;Right;10 reps;Supine Heel Slides: AAROM;Right;10 reps;Supine Hip ABduction/ADduction: AAROM;Right;10 reps;Supine    PT Goals Acute Rehab PT Goals Pt will Perform Home Exercise Program: Independently PT Goal: Perform Home Exercise Program - Progress: Progressing toward goal  Visit Information  Last PT Received On: 04/27/12    Subjective Data  Subjective: I walked in hall with the nurse earlier all the way to the end.   Cognition  Overall Cognitive Status: Appears within functional limits for tasks assessed/performed Arousal/Alertness: Awake/alert Orientation Level: Appears intact for tasks assessed Behavior During Session: [facility] for tasks performed       End of Session PT - End of Session Activity Tolerance: Patient tolerated treatment well Patient left: in bed;with call bell/phone within reach;with family/visitor present   GP    [facility] 04/27/2012, 7:09 PM Sheran Lawless, PT [phone] 04/27/2012

## 2012-04-27 NOTE — Op Note (Signed)
NAMEATHEL, MERRIWEATHER NO.:  [id number]  MEDICAL RECORD NO.:  [id number]  LOCATION:  1612                         FACILITY:  [facility]  PHYSICIAN:  Vanita Panda. 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  PROCEDURE:  Right total hip arthroplasty through direct anterior approach.  IMPLANTS:  DePuy Sector Gription acetabular component size 52 with the apex hole eliminator and 1 screw, size 36+ 4 neutral polyethylene liner, size 12 Corail femoral component with standard offset (KA), size 36- 2 metal hip ball.  SURGEON:  Vanita Panda. Magnus Ivan, M.D.  ASSISTANT:  Wende Neighbors, P.A.  ANESTHESIA:  Spinal.  ESTIMATED BLOOD LOSS:  900 mL.  ANTIBIOTICS:  2 g IV Ancef.  COMPLICATIONS:  None.  INDICATIONS:  Mr. Lux is an active 52 year old individual with a significant hip disease in his right hip.  This has been going on for many years now.  He has had multiple steroid injections to his hip.  He is very active.  It has gotten where it hurts him with night pain, with activities of daily living, with his disability to do a lot of things that he wants to do.  He has gotten stiffening in his hip and has gotten to the point where all of the conservative treatment has failed including rest, anti-inflammatories, and again multiple hip injections. He, at this point, wishes to proceed with a total hip arthroplasty.  He understands the risks of acute blood loss anemia, DVT, infection, fracture, and pulmonary embolus.  He does wish to proceed.  He knows the goals are to decreased pain and increased mobility.  PROCEDURE DESCRIPTION:  After informed consent was obtained, appropriate right  hip was marked.  He was brought to the operating room, and spinal anesthesia was obtained.  He was then laid back in the supine position. After spinal anesthesia was obtained, a Foley catheter was placed and then traction boots were placed on both his feet.  He was next placed supine on the Hana fracture table with the perineal post in place and both legs in inline skeletal traction, but no traction applied.  We then assessed the right hip under direct fluoroscopy as well as the center of the pelvis to obtain leg lengths.  We then prepped the right hip with DuraPrep and sterile drapes.  A time-out was called identifying the correct patient, correct right hip.  We then made an incision just inferior and posterior to the anterior superior iliac spine and carried this obliquely down the leg.  I dissected down to the tensor fascia lata and the tensor fascia was divided longitudinally so I could proceed with a direct anterior approach to the hip.  Cobra retractors were placed around the lateral neck and 1 up underneath the rectus femoris muscle at the medial neck.  I cauterized the lateral femoral circumflex vessels and then opened up the joint capsule in an L-shaped format.  I placed the Cobra retractors within  the hip capsule.  I then made my femoral neck cut just proximal to the lesser trochanter using oscillating saw and finished this with an osteotome.  I then removed the femoral head in its entirety.  There was a large area devoid of cartilage.  It was significantly flattened.  I then placed a Bent Hohmann along the anterior aspect of the acetabulum and posterior-inferior placed a Cobra retractor.  I cleaned the acetabulum debris and then began reaming from size 44 in 2 mm increments up to a size 52 with all reamers placed under direct visualization and the last reamer placed also under direct fluoroscopy, so I could obtain my depth of reaming, my inclination and anteversion.  Once this  was accomplished, I placed the real DePuy Sector acetabular component size 52.  The apex hole eliminator guide and then a single screw for just security purposes.  I then cleaned out the acetabulum of any of other debris and placed the real 36+ 4 neutral polyethylene liner.  Attention was then turned to the femur.  With the leg externally rotated to 90 degrees, extended and adducted, I placed a Mueller retractor medially and a bent Hohmann behind the greater trochanter.  I released the lateral joint capsule and piriformis and brought the leg up higher.  I used a box cutting guide then on the femoral canal and then began broaching with a size 8 broach, all the way up to a size 12.  With a size 12 broach, we trialed a standard neck and a 36+ 1.5 hip ball.  We brought the leg back over and up and with internal rotation traction, reduced the hip.  He had excellent range of motion.  There was stable.  When I measured the leg lengths, he was just enough long that I had dislocated the hip and trialed a 36- 2 hip ball, which gave him equal leg lengths so we could measure in still stability, so we then dislocated the hip again and removed all trial instruments. We placed the real size 12 femoral component from DePuy with standard offset, followed by the real 36- 2 metal hip ball.  We reduced this in the acetabulum, again it was stable.  We then used pulsatile lavage to irrigate the hip joint with normal saline solution.  I closed the joint capsule with interrupted #1 Ethibond suture followed by 2-0 Vicryl in the deep and subcutaneous tissue and 4-0 Monocryl subcuticular stitch, Dermabond and a sterile dressing.  Of note, Maud Deed, PA-C was present and assisted in the entire case, and her assistance was crucial for gaining access to the hip joint, it was useful during placement of trial and final implants, and then she was integral in closing the wound.  He was taken off the Hana table into the  recovery room in stable condition.  All final counts were correct.  There were no complications noted.     Vanita Panda. Magnus Ivan, M.D.     CYB/MEDQ  D:  04/26/2012  T:  04/27/2012  Job:  [id number]

## 2012-04-27 NOTE — Plan of Care (Signed)
Problem: Consults Goal: Diagnosis- Total Joint Replacement Outcome: Completed/Met Date Met:  04/27/12 Primary Total Hip RIGHT Anterior

## 2012-04-28 LAB — CBC
HCT: 33.3 % — ABNORMAL LOW (ref 39.0–52.0)
RDW: 13 % (ref 11.5–15.5)
WBC: 9.7 10*3/uL (ref 4.0–10.5)

## 2012-04-28 NOTE — Progress Notes (Signed)
Physical Therapy Treatment Patient Details Name: Brandon Franco MRN: [id number] DOB: [birth year] Today's Date: 04/28/2012 Time: 4098-1191 PT Time Calculation (min): 17 min  PT Assessment / Plan / Recommendation Comments on Treatment Session  Did well with steps and increased ambulation distance. Plan is for d/c home tomorrow. Recommend HHPT.     Follow Up Recommendations  Home health PT     Does the patient have the potential to tolerate intense rehabilitation     Barriers to Discharge        Equipment Recommendations  Rolling walker with 5" wheels    Recommendations for Other Services    Frequency 7X/week   Plan Discharge plan remains appropriate    Precautions / Restrictions Precautions Precautions: None Restrictions Weight Bearing Restrictions: No RLE Weight Bearing: Weight bearing as tolerated   Pertinent Vitals/Pain "Sore/tightness/pulling front of thigh" unrated    Mobility  Transfers Transfers: Sit to Stand;Stand to Sit Sit to Stand: 4: Min guard;From chair/3-in-1 Stand to Sit: 5: Supervision;To chair/3-in-1 Ambulation/Gait Ambulation/Gait Assistance: 5: Supervision Ambulation Distance (Feet): 300 Feet Assistive device: Rolling walker Gait Pattern: Step-through pattern;Antalgic;Decreased step length - left Stairs: Yes Stairs Assistance: 4: Min guard Stairs Assistance Details (indicate cue type and reason): VCs safety, technique, sequence.  Stair Management Technique: One rail Left;With crutches Number of Stairs: 4  (2x2)    Exercises     PT Diagnosis:    PT Problem List:   PT Treatment Interventions:     PT Goals Acute Rehab PT Goals Pt will go Sit to Stand: with modified independence PT Goal: Sit to Stand - Progress: Progressing toward goal Pt will go Stand to Sit: with modified independence PT Goal: Stand to Sit - Progress: Progressing toward goal Pt will Ambulate: >150 feet;with modified independence;with rolling walker PT Goal: Ambulate -  Progress: Progressing toward goal Pt will Go Up / Down Stairs: 3-5 stairs;with supervision;with rail(s);with crutches PT Goal: Up/Down Stairs - Progress: Partly met  Visit Information  Last PT Received On: 04/28/12    Subjective Data  Subjective: "It just pulls here (along anterior thigh)" Patient Stated Goal: home   Cognition  Overall Cognitive Status: Appears within functional limits for tasks assessed/performed Arousal/Alertness: Awake/alert Orientation Level: Appears intact for tasks assessed Behavior During Session: [facility] for tasks performed    Balance     End of Session PT - End of Session Activity Tolerance: Patient tolerated treatment well Patient left: in chair;with call bell/phone within reach;with family/visitor present   GP     Rebeca Alert [facility] 04/28/2012, 2:33 PM [phone]

## 2012-04-28 NOTE — Progress Notes (Signed)
Physical Therapy Treatment Patient Details Name: Daysen Gundrum MRN: [id number] DOB: 26-Dec-1960 Today's Date: 04/28/2012 Time: 4098-1191 PT Time Calculation (min): 23 min  PT Assessment / Plan / Recommendation Comments on Treatment Session  C/o soreness down anterior upper leg as well as some R knee pain. Will plan to practice steps this pm. Pt states he plans to d/c home on tomorrow. Recommend HHPT    Follow Up Recommendations  Home health PT     Does the patient have the potential to tolerate intense rehabilitation     Barriers to Discharge        Equipment Recommendations  Rolling walker with 5" wheels    Recommendations for Other Services    Frequency 7X/week   Plan Discharge plan remains appropriate    Precautions / Restrictions Precautions Precautions: None Restrictions Weight Bearing Restrictions: No RLE Weight Bearing: Weight bearing as tolerated   Pertinent Vitals/Pain Sore R hip,thigh, knee-unrated    Mobility  Bed Mobility Bed Mobility: Sit to Supine Sit to Supine: 4: Min assist Details for Bed Mobility Assistance: Assist for R LE onto bed Transfers Transfers: Sit to Stand;Stand to Sit Sit to Stand: 4: Min guard;From chair/3-in-1;With armrests Stand to Sit: 4: Min guard;To bed;With armrests Details for Transfer Assistance: Some difficulty with descent due to discomfort Ambulation/Gait Ambulation/Gait Assistance: 4: Min guard Ambulation Distance (Feet): 200 Feet Assistive device: Rolling walker Gait Pattern: Step-to pattern;Decreased stride length    Exercises Total Joint Exercises Ankle Circles/Pumps: AROM;Both;10 reps;Supine Quad Sets: AROM;Both;10 reps;Supine Short Arc Quad: AROM;Right;10 reps;Supine Heel Slides: AAROM;Right;10 reps;Supine Hip ABduction/ADduction: AAROM;10 reps;Right;Supine   PT Diagnosis:    PT Problem List:   PT Treatment Interventions:     PT Goals Acute Rehab PT Goals Pt will go Sit to Supine/Side: with modified  independence PT Goal: Sit to Supine/Side - Progress: Progressing toward goal Pt will go Sit to Stand: with modified independence PT Goal: Sit to Stand - Progress: Progressing toward goal Pt will go Stand to Sit: with modified independence PT Goal: Stand to Sit - Progress: Progressing toward goal Pt will Ambulate: >150 feet;with modified independence;with rolling walker PT Goal: Ambulate - Progress: Progressing toward goal Pt will Perform Home Exercise Program: with supervision, verbal cues required/provided PT Goal: Perform Home Exercise Program - Progress: Progressing toward goal  Visit Information  Last PT Received On: 04/28/12 Assistance Needed: +1    Subjective Data  Subjective: "I walked all the way to the window yesterday" Patient Stated Goal: Home   Cognition  Overall Cognitive Status: Appears within functional limits for tasks assessed/performed Arousal/Alertness: Awake/alert Orientation Level: Appears intact for tasks assessed Behavior During Session: [facility] for tasks performed    Balance     End of Session PT - End of Session Activity Tolerance: Patient tolerated treatment well Patient left: in bed;with call bell/phone within reach   GP     Rebeca Alert [facility] 04/28/2012, 8:52 AM [phone]

## 2012-04-28 NOTE — Progress Notes (Signed)
Subjective: Pt stable - no stairs yet   Objective: Vital signs in last 24 hours: Temp:  [98.5 F (36.9 C)-99.2 F (37.3 C)] 99.2 F (37.3 C) (01/19 0550) Pulse Rate:  [81-87] 87  (01/19 0550) Resp:  [16-18] 16  (01/19 0756) BP: (111-129)/(68-70) 111/68 mmHg (01/19 0550) SpO2:  [97 %-100 %] 97 % (01/19 0550)  Intake/Output from previous day: 01/18 0701 - 01/19 0700 In: 1435 [P.O.:600; I.V.:835] Out: 2225 [Urine:2225] Intake/Output this shift:    Exam:  Neurovascular intact Sensation intact distally Intact pulses distally  Labs:  Basename 04/28/12 0501 04/27/12 0425  HGB 11.7* 12.5*    Basename 04/28/12 0501 04/27/12 0425  WBC 9.7 9.5  RBC 3.72* 4.14*  HCT 33.3* 37.0*  PLT 187 217    Basename 04/27/12 0425  NA 136  K 3.7  CL 101  CO2 28  BUN 12  CREATININE 0.93  GLUCOSE 110*  CALCIUM 8.3*   No results found for this basename: LABPT:2,INR:2 in the last 72 hours  Assessment/Plan: Doing well - dc am   DEAN,GREGORY SCOTT 04/28/2012, 9:23 AM

## 2012-04-29 LAB — CBC
Hemoglobin: 11.2 g/dL — ABNORMAL LOW (ref 13.0–17.0)
MCH: 30.7 pg (ref 26.0–34.0)
RBC: 3.65 MIL/uL — ABNORMAL LOW (ref 4.22–5.81)

## 2012-04-29 MED ORDER — OXYCODONE-ACETAMINOPHEN 5-325 MG PO TABS: 1.0000 | ORAL_TABLET | ORAL | Status: AC | PRN

## 2012-04-29 MED ORDER — METHOCARBAMOL 500 MG PO TABS: 500.0000 mg | ORAL_TABLET | Freq: Four times a day (QID) | ORAL | Status: DC | PRN

## 2012-04-29 MED ORDER — ASPIRIN 325 MG PO TBEC: 325.0000 mg | DELAYED_RELEASE_TABLET | Freq: Two times a day (BID) | ORAL | Status: DC

## 2012-04-29 NOTE — Care Management Note (Signed)
    Page 1 of 2   04/29/2012     2:20:58 PM   CARE MANAGEMENT NOTE 04/29/2012  Patient:  Brandon Franco, Brandon Franco   Account Number:  [id number]  Date Initiated:  04/29/2012  Documentation initiated by:  Colleen Can  Subjective/Objective Assessment:   dx degenerative arthritis rt hip; total hip replacemnt; anterior approach    Referral from doctor's office to [facility] for [facility] services     Action/Plan:   CM spoke with patient. Plans are for patient to return to home  where spouse will be caregiver. He will need RW and 3n1. Genevieve Norlander will provide [facility] services   Anticipated DC Date:  04/29/2012   Anticipated DC Plan:  HOME W HOME HEALTH SERVICES  In-house referral  NA      DC Planning Services  CM consult      [facility] Choice  HOME HEALTH  DURABLE MEDICAL EQUIPMENT   Choice offered to / List presented to:  C-1 Patient   DME arranged  3-N-1  Levan Hurst      DME agency  [facility]     HH arranged  HH-2 PT      [facility] agency  [facility]   Status of service:  Completed, signed off Medicare Important Message given?  NO (If response is "NO", the following Medicare IM given date fields will be blank) Date Medicare IM given:   Date Additional Medicare IM given:    Discharge Disposition:  HOME W HOME HEALTH SERVICES  Per UR Regulation:    If discussed at Long Length of Stay Meetings, dates discussed:    Comments:  04/29/2012 Colleen Can BSN RN CCM [phone] Genevieve Norlander will provide [facility] services with start date of tomorrow 04/30/2014. [facility] DME rep notified of DME need.

## 2012-04-29 NOTE — Discharge Summary (Signed)
Patient ID: Brandon Franco MRN: [id number] DOB/AGE: Jan 22, 1961 52 y.o.  Admit date: 04/26/2012 Discharge date: 04/29/2012  Admission Diagnoses:  Principal Problem:  *Degenerative arthritis of hip   Discharge Diagnoses:  Same  Past Medical History  Diagnosis Date  . Depression   . Hypertension   . Right bundle branch block   . Chronic kidney disease     kidney stone  . Arthritis   . IBS (irritable bowel syndrome)   . History of kidney stones     Surgeries: Procedure(s): TOTAL HIP ARTHROPLASTY ANTERIOR APPROACH on 04/26/2012   Consultants:    Discharged Condition: Improved  Hospital Course: Herley Bernardini is an 52 y.o. male who was admitted 04/26/2012 for operative treatment ofDegenerative arthritis of hip. Patient has severe unremitting pain that affects sleep, daily activities, and work/hobbies. After pre-op clearance the patient was taken to the operating room on 04/26/2012 and underwent  Procedure(s): TOTAL HIP ARTHROPLASTY ANTERIOR APPROACH.    Patient was given perioperative antibiotics: Anti-infectives     Start     Dose/Rate Route Frequency Ordered Stop   04/26/12 2100   ceFAZolin (ANCEF) IVPB 1 g/50 mL premix        1 g 100 mL/hr over 30 Minutes Intravenous Every 6 hours 04/26/12 1808 04/27/12 0334   04/26/12 1230   ceFAZolin (ANCEF) IVPB 2 g/50 mL premix        2 g 100 mL/hr over 30 Minutes Intravenous On call to O.R. 04/26/12 1211 04/26/12 1510           Patient was given sequential compression devices, early ambulation, and chemoprophylaxis to prevent DVT.  Patient benefited maximally from hospital stay and there were no complications.    Recent vital signs: Patient Vitals for the past 24 hrs:  BP Temp Temp src Pulse Resp SpO2  04/29/12 0607 119/65 mmHg 99 F (37.2 C) Oral 77  16  97 %  May 27, 2012 2300 - - - - 16  -  May 27, 2012 2209 112/65 mmHg 99.1 F (37.3 C) Oral 97  16  -  2012-05-27 1915 - - - - 16  -  27-May-2012 1539 - - - - 15  -  May 27, 2012 1356  115/71 mmHg 98.6 F (37 C) Oral 88  16  -  27-May-2012 1135 - - - - 16  -  27-May-2012 0756 - - - - 16  -     Recent laboratory studies:  Basename 04/29/12 0440 05/27/12 0501 04/27/12 0425  WBC 7.1 9.7 --  HGB 11.2* 11.7* --  HCT 32.3* 33.3* --  PLT 181 187 --  NA -- -- 136  K -- -- 3.7  CL -- -- 101  CO2 -- -- 28  BUN -- -- 12  CREATININE -- -- 0.93  GLUCOSE -- -- 110*  INR -- -- --  CALCIUM -- -- 8.3*     Discharge Medications:     Medication List     As of 04/29/2012  7:12 AM    TAKE these medications         acetaminophen 650 MG CR tablet   Commonly known as: TYLENOL   Take 650 mg by mouth every 8 (eight) hours as needed. Pain      aspirin 325 MG EC tablet   Take 1 tablet (325 mg total) by mouth 2 (two) times daily.      methocarbamol 500 MG tablet   Commonly known as: ROBAXIN   Take 1 tablet (500 mg total) by mouth every 6 (  six) hours as needed.      minoxidil 2 % external solution   Commonly known as: ROGAINE   Apply 1 application topically 2 (two) times daily.      multivitamin with minerals tablet   Take 1 tablet by mouth daily.      oxyCODONE-acetaminophen 5-325 MG per tablet   Commonly known as: PERCOCET/ROXICET   Take 1-2 tablets by mouth every 4 (four) hours as needed for pain.      PARoxetine 20 MG tablet   Commonly known as: PAXIL   Take 10 mg by mouth every morning.      triamterene-hydrochlorothiazide 37.5-25 MG per capsule   Commonly known as: DYAZIDE   Take 1 capsule by mouth every morning.      zolpidem 10 MG tablet   Commonly known as: AMBIEN   Take 10 mg by mouth at bedtime as needed. Sleep        Diagnostic Studies: Dg Chest 2 View  04/22/2012  *RADIOLOGY REPORT*  Clinical Data: Preoperative respiratory exam for hip replacement  CHEST - 2 VIEW  Comparison: None.  Findings: Heart size is normal.  Mediastinal shadows are normal. Lungs are clear.  No effusions.  No bony abnormalities.  IMPRESSION: Normal chest   Original Report  Authenticated By: Paulina Fusi, M.D.    Dg Hip Complete Right  04/26/2012  *RADIOLOGY REPORT*  Clinical Data: Right total hip replacement  DG C-ARM 1-60 MIN - NRPT [facility],RIGHT HIP - COMPLETE 2+ VIEW  Comparison: None.  Findings: Two spot fluoroscopic intraoperative views of the right hip demonstrate placement of a total right hip arthroplasty.  No complicating feature identified on these fluoroscopic views.  IMPRESSION: Intraoperative fluoroscopy for total right hip arthroplasty.   Original Report Authenticated By: Britta Mccreedy, M.D.    Dg Pelvis Portable  04/26/2012  *RADIOLOGY REPORT*  Clinical Data: Postop for right hip arthroplasty.  PORTABLE PELVIS  Comparison: Intraoperative study earlier today.  Findings: Single portable AP view demonstrating a right hip arthroplasty.  No acute hardware complication or periprosthetic fracture.  Surgical clips in the left hemi pelvis.  IMPRESSION: Expected appearance after right hip arthroplasty.   Original Report Authenticated By: Jeronimo Greaves, M.D.    Dg Hip Portable 1 View Right  04/26/2012  *RADIOLOGY REPORT*  Clinical Data: Postop for right hip arthroplasty.  PORTABLE RIGHT HIP - 1 VIEW  Comparison: Intraoperative imaging of earlier today.  Findings: Single lateral view demonstrates a right hip arthroplasty. No acute hardware complication.  IMPRESSION: Expected appearance after right hip arthroplasty.   Original Report Authenticated By: Jeronimo Greaves, M.D.    Dg C-arm 1-60 Min-no Report  04/26/2012  *RADIOLOGY REPORT*  Clinical Data: Right total hip replacement  DG C-ARM 1-60 MIN - [facility],RIGHT HIP - COMPLETE 2+ VIEW  Comparison: None.  Findings: Two spot fluoroscopic intraoperative views of the right hip demonstrate placement of a total right hip arthroplasty.  No complicating feature identified on these fluoroscopic views.  IMPRESSION: Intraoperative fluoroscopy for total right hip arthroplasty.   Original Report Authenticated By: Britta Mccreedy, M.D.      Disposition: to home      Discharge Orders    Future Orders Please Complete By Expires   Diet - low sodium heart healthy      Call MD / Call 911      Comments:   If you experience chest pain or shortness of breath, CALL 911 and be transported to the hospital emergency room.  If you develope a  fever above 101 F, pus (white drainage) or increased drainage or redness at the wound, or calf pain, call your surgeon's office.   Constipation Prevention      Comments:   Drink plenty of fluids.  Prune juice may be helpful.  You may use a stool softener, such as Colace (over the counter) 100 mg twice a day.  Use MiraLax (over the counter) for constipation as needed.   Increase activity slowly as tolerated      Discharge instructions      Comments:   Increase your activities as comfort allows. Leave your current dressing in place until this Wed. 05/01/12; then may remove and get your incision wet in the shower. New dry dressing daily after shower this Wed.   Discharge patient         Follow-up Information    Follow up with Kathryne Hitch, MD. In 2 weeks.   Contact information:   [address] [place] [address] [phone]           Signed: Kathryne Hitch 04/29/2012, 7:12 AM

## 2012-04-29 NOTE — Progress Notes (Signed)
Physical Therapy Treatment Patient Details Name: Brandon Franco MRN: [id number] DOB: December 25, 1960 Today's Date: 04/29/2012 Time: 4098-1191 PT Time Calculation (min): 26 min  PT Assessment / Plan / Recommendation Comments on Treatment Session  POD # 3 R THR Direct Anterior Approach.  Plans are to D/C to home today.  Pt given handout on THR HEP and performed all.  Instructed pt on use of ICE.  Instructed pt on side lying positioning.  Instructed pt on car transfer.    Follow Up Recommendations  Home health PT     Does the patient have the potential to tolerate intense rehabilitation     Barriers to Discharge        Equipment Recommendations  Rolling walker with 5" wheels    Recommendations for Other Services    Frequency 7X/week   Plan Discharge plan remains appropriate    Precautions / Restrictions Precautions Precautions: None Precaution Comments: Direct Anterior THR Restrictions Weight Bearing Restrictions: No RLE Weight Bearing: Weight bearing as tolerated   Pertinent Vitals/Pain C/o "soreness" ICE applied    Mobility  Bed Mobility Bed Mobility: Not assessed Details for Bed Mobility Assistance: Pt OOB in recliner Transfers Transfers: Sit to Stand;Stand to Sit Sit to Stand: 6: Modified independent (Device/Increase time) Stand to Sit: 6: Modified independent (Device/Increase time) Details for Transfer Assistance: increased time Ambulation/Gait Ambulation/Gait Assistance: 6: Modified independent (Device/Increase time) Ambulation Distance (Feet): 75 Feet Assistive device: Rolling walker Ambulation/Gait Assistance Details: <25% VC's on safety with turns and backward gait. Gait Pattern: Step-through pattern;Decreased stride length Gait velocity: decreased    Exercises Total Joint Exercises Ankle Circles/Pumps: AROM;Both;10 reps;Supine Quad Sets: AROM;Both;10 reps;Supine Gluteal Sets: AROM;Both;10 reps;Supine Towel Squeeze: AROM;Both;10 reps;Supine Short Arc Quad:  AROM;Right;10 reps;Supine Heel Slides: Right;10 reps;Supine;AROM Hip ABduction/ADduction: Right;10 reps;Supine;AROM Knee Flexion: AROM;Right;10 reps;Supine    PT Goals                                         progressing    Visit Information  Last PT Received On: 04/29/12 Assistance Needed: +1    Subjective Data      Cognition       Balance     End of Session PT - End of Session Equipment Utilized During Treatment: Gait belt Activity Tolerance: Patient tolerated treatment well Patient left: in chair;with call bell/phone within reach;with family/visitor present   Felecia Shelling  PTA [facility]  Acute  Rehab Pager     [phone]

## 2012-04-29 NOTE — Progress Notes (Signed)
Utilization review completed.  

## 2012-05-02 ENCOUNTER — Encounter (HOSPITAL_COMMUNITY): Payer: Self-pay | Admitting: Orthopaedic Surgery

## 2012-05-25 ENCOUNTER — Other Ambulatory Visit: Payer: Self-pay

## 2012-09-04 ENCOUNTER — Emergency Department (HOSPITAL_BASED_OUTPATIENT_CLINIC_OR_DEPARTMENT_OTHER): Payer: BC Managed Care – PPO

## 2012-09-04 ENCOUNTER — Emergency Department (HOSPITAL_BASED_OUTPATIENT_CLINIC_OR_DEPARTMENT_OTHER)
Admission: EM | Admit: 2012-09-04 | Discharge: 2012-09-05 | Disposition: A | Discharge status: Home / Self Care | Payer: BC Managed Care – PPO | Attending: Emergency Medicine | Admitting: Emergency Medicine

## 2012-09-04 ENCOUNTER — Encounter (HOSPITAL_BASED_OUTPATIENT_CLINIC_OR_DEPARTMENT_OTHER): Payer: Self-pay | Admitting: *Deleted

## 2012-09-04 DIAGNOSIS — Z87442 Personal history of urinary calculi: Secondary | ICD-10-CM | POA: Insufficient documentation

## 2012-09-04 DIAGNOSIS — Z8739 Personal history of other diseases of the musculoskeletal system and connective tissue: Secondary | ICD-10-CM | POA: Insufficient documentation

## 2012-09-04 DIAGNOSIS — I129 Hypertensive chronic kidney disease with stage 1 through stage 4 chronic kidney disease, or unspecified chronic kidney disease: Secondary | ICD-10-CM | POA: Insufficient documentation

## 2012-09-04 DIAGNOSIS — N189 Chronic kidney disease, unspecified: Secondary | ICD-10-CM | POA: Insufficient documentation

## 2012-09-04 DIAGNOSIS — Z8679 Personal history of other diseases of the circulatory system: Secondary | ICD-10-CM | POA: Insufficient documentation

## 2012-09-04 DIAGNOSIS — R112 Nausea with vomiting, unspecified: Secondary | ICD-10-CM | POA: Insufficient documentation

## 2012-09-04 DIAGNOSIS — Z8719 Personal history of other diseases of the digestive system: Secondary | ICD-10-CM | POA: Insufficient documentation

## 2012-09-04 DIAGNOSIS — Z7982 Long term (current) use of aspirin: Secondary | ICD-10-CM | POA: Insufficient documentation

## 2012-09-04 DIAGNOSIS — Z79899 Other long term (current) drug therapy: Secondary | ICD-10-CM | POA: Insufficient documentation

## 2012-09-04 DIAGNOSIS — R197 Diarrhea, unspecified: Secondary | ICD-10-CM | POA: Insufficient documentation

## 2012-09-04 NOTE — ED Notes (Addendum)
Pt c/o h/a with n/v x 2 days sent here from [facility]  Received phenergan w/o relief

## 2012-09-05 LAB — BASIC METABOLIC PANEL WITH GFR
BUN: 15 mg/dL (ref 6–23)
CO2: 25 meq/L (ref 19–32)
Calcium: 10 mg/dL (ref 8.4–10.5)
Chloride: 98 meq/L (ref 96–112)
Creatinine, Ser: 0.9 mg/dL (ref 0.50–1.35)
GFR calc Af Amer: >90 mL/min
GFR calc non Af Amer: >90 mL/min
Glucose, Bld: 124 mg/dL — ABNORMAL HIGH (ref 70–99)
Potassium: 3.5 meq/L (ref 3.5–5.1)
Sodium: 138 meq/L (ref 135–145)

## 2012-09-05 MED ORDER — ONDANSETRON 8 MG PO TBDP: ORAL_TABLET | ORAL | Status: DC

## 2012-09-05 MED ORDER — SODIUM CHLORIDE 0.9 % IV BOLUS (SEPSIS)
1000.0000 mL | Freq: Once | INTRAVENOUS | Status: AC
  Administered 2012-09-05: 1000 mL via INTRAVENOUS

## 2012-09-05 MED ORDER — DIPHENHYDRAMINE HCL 50 MG/ML IJ SOLN
12.5000 mg | Freq: Once | INTRAMUSCULAR | Status: AC
  Administered 2012-09-05: 12.5 mg via INTRAVENOUS
  Filled 2012-09-05: qty 1

## 2012-09-05 MED ORDER — ONDANSETRON HCL 4 MG/2ML IJ SOLN
4.0000 mg | Freq: Once | INTRAMUSCULAR | Status: AC
Start: 2012-09-05 — End: 2012-09-05
  Administered 2012-09-05: 4 mg via INTRAVENOUS
  Filled 2012-09-05: qty 2

## 2012-09-05 MED ORDER — METOCLOPRAMIDE HCL 5 MG/ML IJ SOLN
10.0000 mg | Freq: Once | INTRAMUSCULAR | Status: AC
  Administered 2012-09-05: 10 mg via INTRAVENOUS
  Filled 2012-09-05: qty 2

## 2012-09-05 NOTE — ED Provider Notes (Signed)
History     CSN: [id number]  Arrival date & time 09/04/12  2120   First MD Initiated Contact with Patient 09/04/12 2345      Chief Complaint  Patient presents with  . Headache    (Consider location/radiation/quality/duration/timing/severity/associated sxs/prior treatment) Patient is a 52 y.o. male presenting with vomiting. The history is provided by the patient.  Emesis Severity:  Severe Duration:  1 day Timing:  Intermittent Quality:  Stomach contents Progression:  Unchanged Chronicity:  New Recent urination:  Normal Relieved by:  Nothing Worsened by:  Nothing tried Ineffective treatments:  None tried Associated symptoms: diarrhea and headaches   Associated symptoms: no abdominal pain and no fever   Diarrhea:    Quality:  Watery   Number of occurrences:  10   Severity:  Severe   Duration:  1 day   Timing:  Intermittent   Progression:  Unchanged Headaches:    Severity:  Moderate   Onset quality:  Gradual   Timing:  Constant Risk factors: no suspect food intake     Past Medical History  Diagnosis Date  . Depression   . Hypertension   . Right bundle branch block   . Chronic kidney disease     kidney stone  . Arthritis   . IBS (irritable bowel syndrome)   . History of kidney stones     Past Surgical History  Procedure Laterality Date  . Appendectomy    . Hernia repair    . Colonoscopy    . Total hip arthroplasty  04/26/2012    Procedure: TOTAL HIP ARTHROPLASTY ANTERIOR APPROACH;  Surgeon: Kathryne Hitch, MD;  Location: [facility];  Service: Orthopedics;  Laterality: Right;  Right Total Hip Arthroplasty    Family History  Problem Relation Age of Onset  . Colon cancer Paternal Uncle     passed away age 48  . Heart disease Mother   . Hypertension Father     History  Substance Use Topics  . Smoking status: Never Smoker   . Smokeless tobacco: Never Used  . Alcohol Use: 0.6 oz/week    1 Shots of liquor per week     Comment: socially- occasional       Review of Systems  Gastrointestinal: Positive for vomiting and diarrhea. Negative for abdominal pain.  Neurological: Positive for headaches.  All other systems reviewed and are negative.    Allergies  Review of patient's allergies indicates no known allergies.  Home Medications   Current Outpatient Rx  Name  Route  Sig  Dispense  Refill  . acetaminophen (TYLENOL) 650 MG CR tablet   Oral   Take 650 mg by mouth every 8 (eight) hours as needed. Pain         . aspirin EC 325 MG EC tablet   Oral   Take 1 tablet (325 mg total) by mouth 2 (two) times daily.   60 tablet   0   . methocarbamol (ROBAXIN) 500 MG tablet   Oral   Take 1 tablet (500 mg total) by mouth every 6 (six) hours as needed.   60 tablet   0   . minoxidil (ROGAINE) 2 % external solution   Topical   Apply 1 application topically 2 (two) times daily.         . Multiple Vitamins-Minerals (MULTIVITAMIN WITH MINERALS) tablet   Oral   Take 1 tablet by mouth daily.         Marland Kitchen PARoxetine (PAXIL) 20 MG tablet  Oral   Take 10 mg by mouth every morning.          . triamterene-hydrochlorothiazide (DYAZIDE) 37.5-25 MG per capsule   Oral   Take 1 capsule by mouth every morning.          . zolpidem (AMBIEN) 10 MG tablet   Oral   Take 10 mg by mouth at bedtime as needed. Sleep           BP 140/79  Pulse 84  Temp(Src) 98.4 F (36.9 C) (Oral)  Resp 16  Ht 6\' 1"  (1.854 m)  Wt 230 lb (104.327 kg)  BMI 30.35 kg/m2  SpO2 100%  Physical Exam  Constitutional: He is oriented to person, place, and time. He appears well-developed and well-nourished. No distress.  HENT:  Head: Normocephalic and atraumatic.  Mouth/Throat: Oropharynx is clear and moist.  Eyes: Conjunctivae and EOM are normal. Pupils are equal, round, and reactive to light.  Neck: Normal range of motion. Neck supple.  Cardiovascular: Normal rate, regular rhythm and intact distal pulses.   Pulmonary/Chest: Effort normal and breath  sounds normal. He has no wheezes. He has no rales.  Abdominal: Soft. Bowel sounds are normal. There is no tenderness. There is no rebound and no guarding.  Musculoskeletal: Normal range of motion. He exhibits no edema.  Neurological: He is alert and oriented to person, place, and time. He has normal reflexes. No cranial nerve deficit.  Skin: Skin is warm and dry.  Psychiatric: He has a normal mood and affect.    ED Course  Procedures (including critical care time)  Labs Reviewed  BASIC METABOLIC PANEL   No results found.   No diagnosis found.    MDM  Vomiting started first likely secondary to dehydration.  Pain free and better post medication.  No indication for LP at this time.  Follow up with your family doctor for ongoing care        Billee Balcerzak K Aundra Espin-Rasch, MD 09/05/12 [phone]

## 2013-02-13 ENCOUNTER — Other Ambulatory Visit: Payer: Self-pay

## 2014-04-10 DIAGNOSIS — C61 Malignant neoplasm of prostate: Secondary | ICD-10-CM

## 2014-04-10 HISTORY — PX: PROSTATECTOMY: SHX69

## 2014-04-10 HISTORY — DX: Malignant neoplasm of prostate: C61

## 2015-05-10 ENCOUNTER — Telehealth: Payer: Self-pay | Admitting: Internal Medicine

## 2015-05-10 NOTE — Telephone Encounter (Signed)
Pt having problems with reflux. Pt scheduled to see Dr. Henrene Pastor 05/13/15@9am . Pt aware of appt.

## 2015-05-13 ENCOUNTER — Encounter: Payer: Self-pay | Admitting: Internal Medicine

## 2015-05-13 ENCOUNTER — Ambulatory Visit (INDEPENDENT_AMBULATORY_CARE_PROVIDER_SITE_OTHER): Payer: BLUE CROSS/BLUE SHIELD | Admitting: Internal Medicine

## 2015-05-13 VITALS — BP 128/76 | HR 81 | Ht 73.0 in | Wt 248.6 lb

## 2015-05-13 DIAGNOSIS — K219 Gastro-esophageal reflux disease without esophagitis: Secondary | ICD-10-CM

## 2015-05-13 MED ORDER — OMEPRAZOLE 20 MG PO CPDR: 20.0000 mg | DELAYED_RELEASE_CAPSULE | Freq: Every day | ORAL | Status: DC

## 2015-05-13 NOTE — Progress Notes (Signed)
HISTORY OF PRESENT ILLNESS:  Brandon Franco is a 55 y.o. male who is referred by his primary care physician Dr. Domenick Gong regarding new-onset GERD as the chief complaint. This is the first office visit for this patient. He was seen previously in the [facility] for routine screening colonoscopy November 2013. Examination revealed mild sigmoid diverticulosis and an incidental cecal AVM. Otherwise normal. Follow-up in 10 years recommended. The current complaint is that of pyrosis and regurgitation for several months. Symptoms are particularly severe at night. Now occurring during the day. He has been using antacids. He did try 2 weeks of Prilosec OTC which was significantly helpful. However, upon discontinuation symptoms returned within 2 weeks. He denies dysphagia or change in weight. Does have a history of prostate cancer for which he takes Cialis daily. GI review of systems is remarkable otherwise for bloating and gas.  REVIEW OF SYSTEMS:  All non-GI ROS negative except for cough  Past Medical History  Diagnosis Date  . Depression   . Hypertension   . Right bundle branch block   . Chronic kidney disease     kidney stone  . Arthritis   . IBS (irritable bowel syndrome)   . History of kidney stones   . Prostate cancer [facility])     Past Surgical History  Procedure Laterality Date  . Appendectomy    . Hernia repair    . Colonoscopy    . Total hip arthroplasty  04/26/2012    Procedure: TOTAL HIP ARTHROPLASTY ANTERIOR APPROACH;  Surgeon: Mcarthur Rossetti, MD;  Location: [facility];  Service: Orthopedics;  Laterality: Right;  Right Total Hip Arthroplasty  . Prostatectomy  2016    Social History Ramadan Chapmon  reports that he has never smoked. He has never used smokeless tobacco. He reports that he drinks about 0.6 oz of alcohol per week. He reports that he does not use illicit drugs.  family history includes Colon cancer in his paternal uncle; Heart disease in his mother; Hypertension in his  father.  No Known Allergies     PHYSICAL EXAMINATION: Vital signs: BP 128/76 mmHg  Pulse 81  Ht 6\' 1"  (1.854 m)  Wt 248 lb 9.6 oz (112.764 kg)  BMI 32.81 kg/m2  Constitutional: generally well-appearing, no acute distress Psychiatric: alert and oriented x3, cooperative Eyes: extraocular movements intact, anicteric, conjunctiva pink Mouth: oral pharynx moist, no lesions Neck: supple without thyromegaly Lymph: no lymphadenopathy Cardiovascular: heart regular rate and rhythm, no murmur Lungs: clear to auscultation bilaterally Abdomen: soft, obese, nontender, nondistended, no obvious ascites, no peritoneal signs, normal bowel sounds, no organomegaly Rectal: Omitted Extremities: no clubbing cyanosis or lower extremity edema bilaterally Skin: no lesions on visible extremities Neuro: No focal deficits. Normal DTRs  ASSESSMENT:  #1. New onset GERD. Severe. #2. Obesity  PLAN:  #1. Reflux precautions with attention to weight loss and elevation of head of the bed. Reviewed. Also, appropriate literature pulled and provided for his review #2. Prescribe omeprazole 20 mg daily #3. Upper endoscopy to evaluate severe GERD, rule out esophagitis, rule out Barrett's, rule out other conditions masquerading.The nature of the procedure, as well as the risks, benefits, and alternatives were carefully and thoroughly reviewed with the patient. Ample time for discussion and questions allowed. The patient understood, was satisfied, and agreed to proceed.    A copy of this consultation note has been sent to Dr. Osborne Casco

## 2015-05-13 NOTE — Patient Instructions (Signed)
We have sent the following medications to your pharmacy for you to pick up at your convenience:  Omeprazole  You have been scheduled for an endoscopy. Please follow written instructions given to you at your visit today. If you use inhalers (even only as needed), please bring them with you on the day of your procedure. Your physician has requested that you go to www.startemmi.com and enter the access code given to you at your visit today. This web site gives a general overview about your procedure. However, you should still follow specific instructions given to you by our office regarding your preparation for the procedure.   

## 2015-05-25 ENCOUNTER — Encounter: Payer: Self-pay | Admitting: Internal Medicine

## 2015-05-25 ENCOUNTER — Ambulatory Visit (AMBULATORY_SURGERY_CENTER): Payer: BLUE CROSS/BLUE SHIELD | Admitting: Internal Medicine

## 2015-05-25 VITALS — BP 124/78 | HR 68 | Temp 96.0°F | Resp 16 | Ht 73.0 in | Wt 248.0 lb

## 2015-05-25 DIAGNOSIS — K219 Gastro-esophageal reflux disease without esophagitis: Secondary | ICD-10-CM

## 2015-05-25 MED ORDER — SODIUM CHLORIDE 0.9 % IV SOLN: 500.0000 mL | INTRAVENOUS | Status: DC

## 2015-05-25 NOTE — Op Note (Signed)
[place]  [address] [place], [address]   ENDOSCOPY PROCEDURE REPORT  PATIENT: Brandon Franco, Brandon Franco  MR#: [id number] BIRTHDATE: April 13, 1960 , 49  yrs. old GENDER: male ENDOSCOPIST: Eustace Quail, MD REFERRED BY:  Domenick Gong, M.D. PROCEDURE DATE:  05/25/2015 PROCEDURE:  EGD, diagnostic ASA CLASS:     Class II INDICATIONS:  history of esophageal reflux.. Recent development of significant and regular symptoms MEDICATIONS: Monitored anesthesia care and Propofol 170 mg IV TOPICAL ANESTHETIC: none  DESCRIPTION OF PROCEDURE: After the risks benefits and alternatives of the procedure were thoroughly explained, informed consent was obtained.  The [id number] endoscope was introduced through the mouth and advanced to the second portion of the duodenum , Without limitations.  The instrument was slowly withdrawn as the mucosa was fully examined.   EXAM: The esophagus and gastroesophageal junction were completely normal in appearance. No Barrett's or other complications.  The stomach was entered and closely examined.The antrum, angularis, and lesser curvature were well visualized, including a retroflexed view of the cardia and fundus.  The stomach wall was normally distensable, with normal mucosa throughout.  The scope passed easily through the pylorus into the duodenum, which were normal. Retroflexed views revealed a small status hiatal hernia.     The scope was then withdrawn from the patient and the procedure completed.  COMPLICATIONS: There were no immediate complications.  ENDOSCOPIC IMPRESSION: 1. Normal EGD. No Barrett's esophagus 2. GERD  RECOMMENDATIONS: 1.  Anti-reflux regimen to be followed 2.  Continue omeprazole 20 mg daily to control symptoms  REPEAT EXAM:  eSigned:  Eustace Quail, MD 05/25/2015 2:14 PM    CC:The Patient and Domenick Gong, MD

## 2015-05-25 NOTE — Patient Instructions (Signed)
Handout given : AntiReflux  Continue your Omeprazole   YOU HAD AN ENDOSCOPIC PROCEDURE TODAY AT [facility]:   Refer to the procedure report that was given to you for any specific questions about what was found during the examination.  If the procedure report does not answer your questions, please call your gastroenterologist to clarify.  If you requested that your care partner not be given the details of your procedure findings, then the procedure report has been included in a sealed envelope for you to review at your convenience later.  YOU SHOULD EXPECT: Some feelings of bloating in the abdomen. Passage of more gas than usual.  Walking can help get rid of the air that was put into your GI tract during the procedure and reduce the bloating. If you had a lower endoscopy (such as a colonoscopy or flexible sigmoidoscopy) you may notice spotting of blood in your stool or on the toilet paper. If you underwent a bowel prep for your procedure, you may not have a normal bowel movement for a few days.  Please Note:  You might notice some irritation and congestion in your nose or some drainage.  This is from the oxygen used during your procedure.  There is no need for concern and it should clear up in a day or so.  SYMPTOMS TO REPORT IMMEDIATELY:    Following upper endoscopy (EGD)  Vomiting of blood or coffee ground material  New chest pain or pain under the shoulder blades  Painful or persistently difficult swallowing  New shortness of breath  Fever of 100F or higher  Black, tarry-looking stools  For urgent or emergent issues, a gastroenterologist can be reached at any hour by calling [phone].   DIET: Your first meal following the procedure should be a small meal and then it is ok to progress to your normal diet. Heavy or fried foods are harder to digest and may make you feel nauseous or bloated.  Likewise, meals heavy in dairy and vegetables can increase bloating.  Drink  plenty of fluids but you should avoid alcoholic beverages for 24 hours.  ACTIVITY:  You should plan to take it easy for the rest of today and you should NOT DRIVE or use heavy machinery until tomorrow (because of the sedation medicines used during the test).    FOLLOW UP: Our staff will call the number listed on your records the next business day following your procedure to check on you and address any questions or concerns that you may have regarding the information given to you following your procedure. If we do not reach you, we will leave a message.  However, if you are feeling well and you are not experiencing any problems, there is no need to return our call.  We will assume that you have returned to your regular daily activities without incident.  If any biopsies were taken you will be contacted by phone or by letter within the next 1-3 weeks.  Please call us at [phone] if you have not heard about the biopsies in 3 weeks.    SIGNATURES/CONFIDENTIALITY: You and/or your care partner have signed paperwork which will be entered into your electronic medical record.  These signatures attest to the fact that that the information above on your After Visit Summary has been reviewed and is understood.  Full responsibility of the confidentiality of this discharge information lies with you and/or your care-partner.

## 2015-05-25 NOTE — Progress Notes (Signed)
To recovery, report to Tyrell, RN, VSS. 

## 2015-05-26 ENCOUNTER — Telehealth: Payer: Self-pay | Admitting: Emergency Medicine

## 2015-05-26 NOTE — Telephone Encounter (Signed)
  Follow up Call-  Call back number 05/25/2015  Post procedure Call Back phone  # [phone]  Permission to leave phone message Yes     Patient questions:  Do you have a fever, pain , or abdominal swelling? No. Pain Score  0 *  Have you tolerated food without any problems? Yes.    Have you been able to return to your normal activities? Yes.    Do you have any questions about your discharge instructions: Diet   No. Medications  No. Follow up visit  No.  Do you have questions or concerns about your Care? No.  Actions: * If pain score is 4 or above: No action needed, pain <4.

## 2015-06-10 ENCOUNTER — Telehealth: Payer: Self-pay | Admitting: Internal Medicine

## 2015-06-11 MED ORDER — OMEPRAZOLE 20 MG PO CPDR
20.0000 mg | DELAYED_RELEASE_CAPSULE | Freq: Every day | ORAL | Status: DC
Start: 2015-06-11 — End: 2016-01-26

## 2015-06-11 NOTE — Telephone Encounter (Signed)
Refilled omeprazole - 90 day supply

## 2015-07-28 ENCOUNTER — Telehealth: Payer: Self-pay

## 2015-07-28 NOTE — Telephone Encounter (Signed)
Omeprazole sent in as 90 day supply

## 2016-01-26 ENCOUNTER — Other Ambulatory Visit: Payer: Self-pay | Admitting: Internal Medicine

## 2016-07-30 ENCOUNTER — Other Ambulatory Visit: Payer: Self-pay | Admitting: Internal Medicine

## 2016-08-30 ENCOUNTER — Ambulatory Visit (INDEPENDENT_AMBULATORY_CARE_PROVIDER_SITE_OTHER): Payer: Self-pay

## 2016-08-30 ENCOUNTER — Ambulatory Visit (INDEPENDENT_AMBULATORY_CARE_PROVIDER_SITE_OTHER): Payer: BLUE CROSS/BLUE SHIELD | Admitting: Orthopaedic Surgery

## 2016-08-30 DIAGNOSIS — M545 Low back pain, unspecified: Secondary | ICD-10-CM

## 2016-08-30 DIAGNOSIS — M25551 Pain in right hip: Secondary | ICD-10-CM

## 2016-08-30 MED ORDER — GABAPENTIN 300 MG PO CAPS: 300.0000 mg | ORAL_CAPSULE | Freq: Every day | ORAL | 0 refills | Status: DC

## 2016-08-30 MED ORDER — METHYLPREDNISOLONE 4 MG PO TABS: ORAL_TABLET | ORAL | 0 refills | Status: DC

## 2016-08-30 NOTE — Progress Notes (Signed)
Brandon Franco is well-known to me. We performed a right total hip arthroplasty on about 4 years ago. 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 He will make sure the implant looks well. He denies any locking catching in his hip he denies any weakness in his legs or numbness and tingling going down his legs or feet. It hurts mainly when he is playing tennis but does wake him up at night and when he gets up first in the morning is having some stiffness.  On examination his hip has fluid active and passive range of motion with no pain in the groin at all there is no blocks to rotation all either. There is no swelling of the thigh incisions well-healed. He  AP pelvis and lateral of his right hip show well-seated implant with no, getting features.  Examination of his lumbar spine does show pain in the facet joints of lowest x-rays of his lumbar spine but no radicular component. He has 5 out of 5 strength in his bilateral extremities and normal sensation. He has no pain of the trochanteric area of his hip he has some pain around the pelvis.  I do feel this is more of a muscle discomfort strain and may be back related. I'm we'll try, patient was steroid taper followed by 2 Aleve twice a day for a week. I will have him hold out of contact sports or maybe another week. I will try Neurontin at night. I like see him back in 4 weeks. He still having the same problems a like an AP and lateral of his lumbar spine.

## 2016-09-24 ENCOUNTER — Encounter (HOSPITAL_BASED_OUTPATIENT_CLINIC_OR_DEPARTMENT_OTHER): Payer: Self-pay | Admitting: Emergency Medicine

## 2016-09-24 ENCOUNTER — Emergency Department (HOSPITAL_BASED_OUTPATIENT_CLINIC_OR_DEPARTMENT_OTHER)
Admission: EM | Admit: 2016-09-24 | Discharge: 2016-09-24 | Disposition: A | Discharge status: Home / Self Care | Payer: BLUE CROSS/BLUE SHIELD | Attending: Emergency Medicine | Admitting: Emergency Medicine

## 2016-09-24 ENCOUNTER — Emergency Department (HOSPITAL_BASED_OUTPATIENT_CLINIC_OR_DEPARTMENT_OTHER): Payer: BLUE CROSS/BLUE SHIELD

## 2016-09-24 DIAGNOSIS — R945 Abnormal results of liver function studies: Secondary | ICD-10-CM | POA: Diagnosis not present

## 2016-09-24 DIAGNOSIS — R0789 Other chest pain: Secondary | ICD-10-CM | POA: Insufficient documentation

## 2016-09-24 DIAGNOSIS — Z7982 Long term (current) use of aspirin: Secondary | ICD-10-CM | POA: Insufficient documentation

## 2016-09-24 DIAGNOSIS — I10 Essential (primary) hypertension: Secondary | ICD-10-CM | POA: Insufficient documentation

## 2016-09-24 DIAGNOSIS — R079 Chest pain, unspecified: Secondary | ICD-10-CM | POA: Diagnosis present

## 2016-09-24 DIAGNOSIS — R748 Abnormal levels of other serum enzymes: Secondary | ICD-10-CM

## 2016-09-24 DIAGNOSIS — Z79899 Other long term (current) drug therapy: Secondary | ICD-10-CM | POA: Diagnosis not present

## 2016-09-24 HISTORY — DX: Gastro-esophageal reflux disease without esophagitis: K21.9

## 2016-09-24 LAB — COMPREHENSIVE METABOLIC PANEL
ALT: 492 U/L — ABNORMAL HIGH (ref 17–63)
ANION GAP: 8 (ref 5–15)
AST: 391 U/L — ABNORMAL HIGH (ref 15–41)
Albumin: 3.9 g/dL (ref 3.5–5.0)
Alkaline Phosphatase: 127 U/L — ABNORMAL HIGH (ref 38–126)
BILIRUBIN TOTAL: 0.9 mg/dL (ref 0.3–1.2)
BUN: 23 mg/dL — ABNORMAL HIGH (ref 6–20)
CALCIUM: 8.9 mg/dL (ref 8.9–10.3)
CO2: 25 mmol/L (ref 22–32)
Chloride: 102 mmol/L (ref 101–111)
Creatinine, Ser: 1.29 mg/dL — ABNORMAL HIGH (ref 0.61–1.24)
GFR calc Af Amer: >60 mL/min (ref >60)
Glucose, Bld: 115 mg/dL — ABNORMAL HIGH (ref 65–99)
POTASSIUM: 3 mmol/L — AB (ref 3.5–5.1)
Sodium: 135 mmol/L (ref 135–145)
TOTAL PROTEIN: 6.6 g/dL (ref 6.5–8.1)

## 2016-09-24 LAB — CBC WITH DIFFERENTIAL/PLATELET
BASOS ABS: 0 10*3/uL (ref 0.0–0.1)
BASOS PCT: 1 %
EOS PCT: 3 %
Eosinophils Absolute: 0.2 10*3/uL (ref 0.0–0.7)
HEMATOCRIT: 40.8 % (ref 39.0–52.0)
Hemoglobin: 14.7 g/dL (ref 13.0–17.0)
LYMPHS PCT: 19 %
Lymphs Abs: 1.2 10*3/uL (ref 0.7–4.0)
MCH: 31.8 pg (ref 26.0–34.0)
MCHC: 36 g/dL (ref 30.0–36.0)
MCV: 88.3 fL (ref 78.0–100.0)
MONO ABS: 0.5 10*3/uL (ref 0.1–1.0)
Monocytes Relative: 7 %
NEUTROS ABS: 4.4 10*3/uL (ref 1.7–7.7)
Neutrophils Relative %: 70 %
PLATELETS: 213 10*3/uL (ref 150–400)
RBC: 4.62 MIL/uL (ref 4.22–5.81)
RDW: 13.1 % (ref 11.5–15.5)
WBC: 6.3 10*3/uL (ref 4.0–10.5)

## 2016-09-24 LAB — TROPONIN I: Troponin I: <0.03 ng/mL (ref <0.03)

## 2016-09-24 LAB — LIPASE, BLOOD: Lipase: 33 U/L (ref 11–51)

## 2016-09-24 MED ORDER — SODIUM CHLORIDE 0.9 % IV BOLUS (SEPSIS)
1000.0000 mL | Freq: Once | INTRAVENOUS | Status: AC
  Administered 2016-09-24: 1000 mL via INTRAVENOUS

## 2016-09-24 MED ORDER — POTASSIUM CHLORIDE CRYS ER 20 MEQ PO TBCR
40.0000 meq | EXTENDED_RELEASE_TABLET | Freq: Once | ORAL | Status: AC
  Administered 2016-09-24: 40 meq via ORAL
  Filled 2016-09-24: qty 2

## 2016-09-24 NOTE — Discharge Instructions (Signed)
Follow-up with PCP for further evaluation and regarding elevated liver function enzymes. Continue home medications as previously prescribed. Return to ED for worsening pain, trouble breathing, diaphoresis, coughing up blood, fevers, numbness, weakness or trouble walking.

## 2016-09-24 NOTE — ED Triage Notes (Addendum)
"   Yesterday, I was playing tennis and I when  I got up from sitting ,  my vision was  blurry and I was dizzy ". Last night felt like"heartburn"  and BP was 89/54. Felt better this am and then heartburn returned x 90 min ago and decided to come to ED. Hx of GERD at hs but does not felt like reflux discomfort

## 2016-09-24 NOTE — ED Provider Notes (Signed)
[facility] Provider Note   CSN: [id number] Arrival date & time: 09/24/16  1545  By signing my name below, I, Hansel Feinstein, attest that this documentation has been prepared under the direction and in the presence of Adorian Gwynne, PA-C. Electronically Signed: Hansel Feinstein, ED Scribe. 09/24/16. 4:25 PM.    History   Chief Complaint Chief Complaint  Patient presents with  . Chest Pain    HPI Brandon Franco is a 56 y.o. male with h/o RBBB who presents to the Emergency Department complaining of improving left-sided, sharp CP that initially began last night, resolved this AM and recurred this afternoon. Patient again reports improvement in pain at this moment. Pt states upon standing after playing tennis outside yesterday, he transiently experienced dizziness and blurred vision, which has now resolved. Per pt, he felt better when he got home, but found his BP to be 88/50. He states he was staying hydrated and eating well throughout the day, and denies LOC, falling, head injury, additional strenuous activity. Per pt, his CP began before bed and he describes it as similar to heart burn except on the L side. He denies diaphoresis or SOB with the onset of his pain. Pt takes daily Losartan for BP and states his normal BP runs around 120/80. Pt reports he takes 81 mg daily, but took two 325 mg ASA today with the recurrence of his pain, which provided him some relief. No h/o similar symptoms, MI, CAD, aneurism, palpitations, CHF, PE/DVT. He reports h/o GERD. Of note, wife of pt reports that he retired 1 month ago and feels that the pts BP medication may need to be lowered soon. Pt plans on following up with his PCP tomorrow. He denies cough, exertional SOB, sneezing, fevers, leg swelling, abdominal pain, nausea, vomiting, diarrhea, unilateral numbness or weakness, current visual changes, HA.   The history is provided by the patient. No language interpreter was used.    Past Medical History:    Diagnosis Date  . Arthritis   . Chronic kidney disease    kidney stone  . Depression   . GERD (gastroesophageal reflux disease)   . History of kidney stones   . Hypertension   . IBS (irritable bowel syndrome)   . Prostate cancer ([place])   . Right bundle branch block     Patient Active Problem List   Diagnosis Date Noted  . Degenerative arthritis of hip 04/26/2012    Past Surgical History:  Procedure Laterality Date  . APPENDECTOMY    . COLONOSCOPY    . HERNIA REPAIR    . PROSTATECTOMY  2016  . TOTAL HIP ARTHROPLASTY  04/26/2012   Procedure: TOTAL HIP ARTHROPLASTY ANTERIOR APPROACH;  Surgeon: Mcarthur Rossetti, MD;  Location: [facility];  Service: Orthopedics;  Laterality: Right;  Right Total Hip Arthroplasty       Home Medications    Prior to Admission medications   Medication Sig Start Date End Date Taking? Authorizing Provider  aspirin 81 MG tablet Take 81 mg by mouth daily.   Yes [provider]  omeprazole (PRILOSEC) 20 MG capsule TAKE ONE CAPSULE (20 MG TOTAL) BY MOUTH DAILY. 07/31/16  Yes Irene Shipper, MD  sildenafil (VIAGRA) 100 MG tablet Take 100 mg by mouth daily as needed for erectile dysfunction.   Yes [provider]  tadalafil (CIALIS) 5 MG tablet Take 5 mg by mouth daily.   Yes [provider]  valsartan-hydrochlorothiazide (DIOVAN-HCT) 160-12.5 MG tablet Take by mouth.   Yes  [provider]  gabapentin (NEURONTIN) 300 MG capsule Take 1 capsule (300 mg total) by mouth at bedtime. 08/30/16   Mcarthur Rossetti, MD  methylPREDNISolone (MEDROL) 4 MG tablet Medrol dose pack. Take as instructed 08/30/16   Mcarthur Rossetti, MD  minoxidil (ROGAINE) 2 % external solution Apply 1 application topically 2 (two) times daily.    [provider]  zolpidem (AMBIEN) 10 MG tablet Take 10 mg by mouth at bedtime as needed. Sleep 10/25/11   [provider]    Family History Family History  Problem Relation Age of  Onset  . Colon cancer Paternal Uncle        passed away age 41  . Heart disease Mother   . Hypertension Father     Social History Social History  Substance Use Topics  . Smoking status: Never Smoker  . Smokeless tobacco: Never Used  . Alcohol use 0.6 oz/week    1 Shots of liquor per week     Comment: socially- occasional     Allergies   Patient has no known allergies.   Review of Systems Review of Systems  Constitutional: Negative for diaphoresis and fever.  HENT: Negative for sneezing.   Eyes: Negative for visual disturbance.  Respiratory: Negative for cough and shortness of breath.   Cardiovascular: Positive for chest pain. Negative for palpitations and leg swelling.  Gastrointestinal: Negative for abdominal pain, diarrhea, nausea and vomiting.  Neurological: Negative for dizziness, syncope, weakness, numbness and headaches.  All other systems reviewed and are negative.    Physical Exam Updated Vital Signs BP (!) 107/58   Pulse (!) 57   Temp 98.2 F (36.8 C) (Oral)   Resp 20   Ht 6\' 1"  (1.854 m)   Wt 108.9 kg (240 lb)   SpO2 99%   BMI 31.66 kg/m   Physical Exam  Constitutional: He is oriented to person, place, and time. He appears well-developed and well-nourished. No distress.  HENT:  Head: Normocephalic and atraumatic.  Nose: Nose normal.  Eyes: Conjunctivae and EOM are normal. Pupils are equal, round, and reactive to light. Right eye exhibits no discharge. Left eye exhibits no discharge. No scleral icterus.  Neck: Normal range of motion. Neck supple.  Cardiovascular: Normal rate, regular rhythm, normal heart sounds and intact distal pulses.  Exam reveals no gallop and no friction rub.   No murmur heard. Pulmonary/Chest: Effort normal and breath sounds normal. No respiratory distress. He has no wheezes. He has no rales. He exhibits no tenderness.  CP not reproducible with palpation.   Abdominal: Soft. Bowel sounds are normal. He exhibits no distension.  There is no tenderness. There is no guarding.  Musculoskeletal: Normal range of motion. He exhibits no edema.  No leg swelling present.   Neurological: He is alert and oriented to person, place, and time. No cranial nerve deficit or sensory deficit. He exhibits normal muscle tone. Coordination normal.  Skin: Skin is warm and dry. No rash noted. He is not diaphoretic.  Psychiatric: He has a normal mood and affect.  Nursing note and vitals reviewed.    ED Treatments / Results   DIAGNOSTIC STUDIES: Oxygen Saturation is 96% on RA, adequate by my interpretation.    COORDINATION OF CARE: 4:21 PM Discussed treatment plan with pt at bedside which includes lab work, CXR and pt agreed to plan.    Labs (all labs ordered are listed, but only abnormal results are displayed) Labs Reviewed  COMPREHENSIVE METABOLIC PANEL - Abnormal; Notable  for the following:       Result Value   Potassium 3.0 (*)    Glucose, Bld 115 (*)    BUN 23 (*)    Creatinine, Ser 1.29 (*)    AST 391 (*)    ALT 492 (*)    Alkaline Phosphatase 127 (*)    All other components within normal limits  LIPASE, BLOOD  CBC WITH DIFFERENTIAL/PLATELET  TROPONIN I    EKG  EKG Interpretation  Date/Time:  Sunday September 24 2016 15:55:52 EDT Ventricular Rate:  71 PR Interval:    QRS Duration: 154 QT Interval:  409 QTC Calculation: 445 R Axis:   79 Text Interpretation:  Sinus rhythm Ventricular premature complex Right bundle branch block Otherwise no significant change Confirmed by Addison Lank [phone]) on 09/24/2016 5:46:51 PM       Radiology Dg Chest 2 View  Result Date: 09/24/2016 CLINICAL DATA:  56 y/o  M; left-sided sharp chest pain. EXAM: CHEST  2 VIEW COMPARISON:  04/22/2012 chest radiograph FINDINGS: The heart size and mediastinal contours are within normal limits. Both lungs are clear. The visualized skeletal structures are unremarkable. IMPRESSION: No active cardiopulmonary disease. Electronically Signed   By:  Kristine Garbe M.D.   On: 09/24/2016 16:59    Procedures Procedures (including critical care time)  Medications Ordered in ED Medications  potassium chloride SA (K-DUR,KLOR-CON) CR tablet 40 mEq (40 mEq Oral Given 09/24/16 1707)  sodium chloride 0.9 % bolus 1,000 mL (0 mLs Intravenous Stopped 09/24/16 1818)     Initial Impression / Assessment and Plan / ED Course  I have reviewed the triage vital signs and the nursing notes.  Pertinent labs & imaging results that were available during my care of the patient were reviewed by me and considered in my medical decision making (see chart for details).     Patient presents to ED for chest pain that is intermittent since last night. H/o of GERD. No previous history of MI, CVA, PE, DVT. He states some relief of pain with ASA. Also reports history of low BP yesterday after playing tennis outside. No LOC or head injury. BP 107/58 in ED today. EKG revealed RBBB, which patient has history of since adolescence, he states. No other changes noted. He reports compliance with HTN medication. CXR unremarkable. Low suspicion for DVT/PE based on [facility] and physical exam findings. Troponin negative x1. CBC unremarkable. CMP revealed low K at 3.0. This was repleated orally here in the ED. Also showed mild bump in Cr. Given fluids here in ED. Of note, patient's LFTs elevated today. No values to compare to. No abdominal tenderness noted on physical exam. I informed patient of his negative work up today. I also made him aware of the elevated LFTs and informed him that he should let his PCP know for further evaluation. He denies any history of similar, but is unsure. Patient states he is already in contact with his PCP and will schedule appointment for this week. He and wife will discuss with PCP the possibility of lowering his BP medication. I advised them that they should continue his usual dosage until being seen by PCP. They agreed to plan. Due to no previous  history of heart disease, age, no new EKG changes and negative trop giving low risk HEART score, will discharge patient home with strict return precautions. He appears stable for discharge at this time.  Final Clinical Impressions(s) / ED Diagnoses   Final diagnoses:  Chest wall pain  Elevated  liver enzymes    New Prescriptions Discharge Medication List as of 09/24/2016  6:14 PM      I personally performed the services described in this documentation, which was scribed in my presence. The recorded information has been reviewed and is accurate.    Delia Heady, PA-C 09/25/16 [place], MD 09/27/16 [phone]

## 2016-09-24 NOTE — ED Notes (Signed)
Patient transported to X-ray 

## 2016-09-27 ENCOUNTER — Other Ambulatory Visit: Payer: Self-pay | Admitting: Internal Medicine

## 2016-09-27 ENCOUNTER — Ambulatory Visit (INDEPENDENT_AMBULATORY_CARE_PROVIDER_SITE_OTHER): Payer: BLUE CROSS/BLUE SHIELD | Admitting: Orthopaedic Surgery

## 2016-09-27 ENCOUNTER — Encounter (INDEPENDENT_AMBULATORY_CARE_PROVIDER_SITE_OTHER): Payer: Self-pay | Admitting: Orthopaedic Surgery

## 2016-09-27 ENCOUNTER — Ambulatory Visit (INDEPENDENT_AMBULATORY_CARE_PROVIDER_SITE_OTHER): Payer: BLUE CROSS/BLUE SHIELD

## 2016-09-27 VITALS — Ht 73.0 in | Wt 240.0 lb

## 2016-09-27 DIAGNOSIS — G8929 Other chronic pain: Secondary | ICD-10-CM | POA: Diagnosis not present

## 2016-09-27 DIAGNOSIS — M545 Low back pain, unspecified: Secondary | ICD-10-CM

## 2016-09-27 DIAGNOSIS — M25561 Pain in right knee: Secondary | ICD-10-CM

## 2016-09-27 DIAGNOSIS — R7989 Other specified abnormal findings of blood chemistry: Secondary | ICD-10-CM

## 2016-09-27 DIAGNOSIS — R945 Abnormal results of liver function studies: Principal | ICD-10-CM

## 2016-09-27 NOTE — Progress Notes (Signed)
The patient's well-known to me. He is following up with his low back and and states that he is not 100% but is doing much better overall. He says he feels like it something he has a live with. He denies any radicular symptoms at all any weakness. He has laid off from tennis as well. He has had some blood pressure issues recently that did taken to the emergency room. His primary care physician is working on just in his medications and is following his liver function tests as well. He has been having some right knee stiffness only take a look at his right knee today as well.  On examination he has excellent flexion extension of the right knee which is a little bit of stiffness with no effusion. The knee is ligamentously stable. His low back is also stiff but not significant pain with good motion.  He still work on quad strengthening exercises and stretching as well as core stretching and strengthening exercises work on weight loss. All questions were encouraged and answered we had a good discussion. He'll follow up as needed.

## 2016-10-06 ENCOUNTER — Ambulatory Visit
Admission: RE | Admit: 2016-10-06 | Discharge: 2016-10-06 | Disposition: A | Discharge status: Home / Self Care | Payer: BLUE CROSS/BLUE SHIELD | Source: Ambulatory Visit | Attending: Internal Medicine | Admitting: Internal Medicine

## 2016-10-06 DIAGNOSIS — R7989 Other specified abnormal findings of blood chemistry: Secondary | ICD-10-CM

## 2016-10-06 DIAGNOSIS — R945 Abnormal results of liver function studies: Principal | ICD-10-CM

## 2016-10-23 ENCOUNTER — Ambulatory Visit: Payer: BLUE CROSS/BLUE SHIELD | Admitting: Nurse Practitioner

## 2016-11-03 ENCOUNTER — Other Ambulatory Visit: Payer: Self-pay | Admitting: Internal Medicine

## 2016-12-07 ENCOUNTER — Encounter: Payer: Self-pay | Admitting: Gastroenterology

## 2016-12-07 ENCOUNTER — Ambulatory Visit (INDEPENDENT_AMBULATORY_CARE_PROVIDER_SITE_OTHER): Payer: BLUE CROSS/BLUE SHIELD | Admitting: Gastroenterology

## 2016-12-07 VITALS — BP 130/78 | HR 62 | Ht 73.0 in | Wt 245.0 lb

## 2016-12-07 DIAGNOSIS — K219 Gastro-esophageal reflux disease without esophagitis: Secondary | ICD-10-CM | POA: Diagnosis not present

## 2016-12-07 DIAGNOSIS — R7989 Other specified abnormal findings of blood chemistry: Secondary | ICD-10-CM

## 2016-12-07 DIAGNOSIS — R945 Abnormal results of liver function studies: Secondary | ICD-10-CM

## 2016-12-07 MED ORDER — OMEPRAZOLE 20 MG PO CPDR: 20.0000 mg | DELAYED_RELEASE_CAPSULE | Freq: Every day | ORAL | 3 refills | Status: DC

## 2016-12-07 NOTE — Progress Notes (Signed)
Agree with assessment and plans 

## 2016-12-07 NOTE — Progress Notes (Signed)
12/07/2016 Brandon Franco [id number] 05/22/1960   HISTORY OF PRESENT ILLNESS:  This is a pleasant 56 year old male who is known to  Dr. Henrene Pastor.  He follows here for colonoscopy and treatment of GERD.  He has been on omeprazole 20 mg daily with good control of his symptoms.  He recently had run out of his medication and coincidental was hospitalized for hypotension.  During that hospital stay  his LFTs were extremely elevated with an AST of 391 and ALT of 492. His alkaline phosphatase was normal at 127, total bilirubin of 0.9.  Ultrasound and CT scan have only shown fatty liver.  His LFTs completely returned to normal within a few weeks time and earlier this month remain with an AST of 25, ALT 34, total bili 0.9, and alkaline phosphatase 80. He denies any complaints, but was wondering if the omeprazole had caused the elevation in his LFTs. He has still been off of the omeprazole but has noticed his reflux symptoms returning so if there was no correlation he would like to restart his medicine.    Past Medical History:  Diagnosis Date  . Arthritis   . Chronic kidney disease    kidney stone  . Depression   . GERD (gastroesophageal reflux disease)   . History of kidney stones   . Hypertension   . IBS (irritable bowel syndrome)   . Prostate cancer ([place])   . Right bundle branch block    Past Surgical History:  Procedure Laterality Date  . APPENDECTOMY    . COLONOSCOPY    . HERNIA REPAIR    . PROSTATECTOMY  2016  . TOTAL HIP ARTHROPLASTY  04/26/2012   Procedure: TOTAL HIP ARTHROPLASTY ANTERIOR APPROACH;  Surgeon: Mcarthur Rossetti, MD;  Location: [facility];  Service: Orthopedics;  Laterality: Right;  Right Total Hip Arthroplasty    reports that he has never smoked. He has never used smokeless tobacco. He reports that he drinks about 0.6 oz of alcohol per week . He reports that he does not use drugs. family history includes Colon cancer in his paternal uncle; Heart disease in his mother;  Hypertension in his father. No Known Allergies    Outpatient Encounter Prescriptions as of 12/07/2016  Medication Sig  . aspirin 81 MG tablet Take 81 mg by mouth daily.  . minoxidil (ROGAINE) 2 % external solution Apply 1 application topically 2 (two) times daily.  Marland Kitchen omeprazole (PRILOSEC) 20 MG capsule TAKE ONE CAPSULE (20 MG TOTAL) BY MOUTH DAILY.  . sildenafil (VIAGRA) 100 MG tablet Take 100 mg by mouth daily as needed for erectile dysfunction.  . valsartan (DIOVAN) 160 MG tablet Take 160 mg by mouth daily.  . [DISCONTINUED] gabapentin (NEURONTIN) 300 MG capsule Take 1 capsule (300 mg total) by mouth at bedtime.  . [DISCONTINUED] methylPREDNISolone (MEDROL) 4 MG tablet Medrol dose pack. Take as instructed  . [DISCONTINUED] tadalafil (CIALIS) 5 MG tablet Take 5 mg by mouth daily.  . [DISCONTINUED] valsartan-hydrochlorothiazide (DIOVAN-HCT) 160-12.5 MG tablet Take by mouth.  . [DISCONTINUED] zolpidem (AMBIEN) 10 MG tablet Take 10 mg by mouth at bedtime as needed. Sleep   No facility-administered encounter medications on file as of 12/07/2016.      REVIEW OF SYSTEMS  : All other systems reviewed and negative except where noted in the History of Present Illness.   PHYSICAL EXAM: BP 130/78   Pulse 62   Ht 6\' 1"  (1.854 m)   Wt 245 lb (111.1 kg)  BMI 32.32 kg/m  General: Well developed white male in no acute distress Head: Normocephalic and atraumatic Eyes:  Sclerae anicteric, conjunctiva pink. Ears: Normal auditory acuity Lungs: Clear throughout to auscultation; no increased WOB. Heart: Regular rate and rhythm; no M/R/G. Abdomen: Soft, non-distended.  BS present.  Non-tender. Musculoskeletal: Symmetrical with no gross deformities  Skin: No lesions on visible extremities Extremities: No edema  Neurological: Alert oriented x 4, grossly non-focal Psychological:  Alert and cooperative. Normal mood and affect  ASSESSMENT AND PLAN: *GERD:  Will restart omeprazole 20 mg  daily. *Elevated LFT's:  Isolated incident when he was hospitalized with hypotension.  They have returned to normal.  He had run out of the omeprazole around the same time so he wanted to make sure that we did not think his elevated LFT's were due to the omeprazole.  I told him that I do not think so and that his elevated LFT's were probably due to some hypoperfusion when his blood pressure was 80/50.  LFT's will be monitored by his PCP.   CC:  Tisovec, Fransico Him, MD

## 2016-12-07 NOTE — Patient Instructions (Addendum)
If you are age 55 or older, your body mass index should be between 23-30. Your Body mass index is 32.32 kg/m. If this is out of the aforementioned range listed, please consider follow up with your Primary Care Provider.  If you are age 44 or younger, your body mass index should be between 19-25. Your Body mass index is 32.32 kg/m. If this is out of the aformentioned range listed, please consider follow up with your Primary Care Provider.   We have sent the following medications to your pharmacy for you to pick up at your convenience: Omeprazole  Follow up as needed.

## 2017-12-01 ENCOUNTER — Other Ambulatory Visit: Payer: Self-pay | Admitting: Gastroenterology

## 2018-10-23 ENCOUNTER — Ambulatory Visit (INDEPENDENT_AMBULATORY_CARE_PROVIDER_SITE_OTHER): Payer: BC Managed Care – PPO | Admitting: Orthopaedic Surgery

## 2018-10-23 ENCOUNTER — Ambulatory Visit (INDEPENDENT_AMBULATORY_CARE_PROVIDER_SITE_OTHER): Payer: BC Managed Care – PPO

## 2018-10-23 ENCOUNTER — Encounter: Payer: Self-pay | Admitting: Orthopaedic Surgery

## 2018-10-23 ENCOUNTER — Other Ambulatory Visit: Payer: Self-pay

## 2018-10-23 DIAGNOSIS — M722 Plantar fascial fibromatosis: Secondary | ICD-10-CM

## 2018-10-23 DIAGNOSIS — M25561 Pain in right knee: Secondary | ICD-10-CM

## 2018-10-23 DIAGNOSIS — G8929 Other chronic pain: Secondary | ICD-10-CM | POA: Diagnosis not present

## 2018-10-23 MED ORDER — METHYLPREDNISOLONE ACETATE 40 MG/ML IJ SUSP
40.0000 mg | INTRAMUSCULAR | Status: AC | PRN
  Administered 2018-10-23: 40 mg via INTRA_ARTICULAR

## 2018-10-23 MED ORDER — LIDOCAINE HCL 1 % IJ SOLN
3.0000 mL | INTRAMUSCULAR | Status: AC | PRN
  Administered 2018-10-23: 3 mL

## 2018-10-23 MED ORDER — LIDOCAINE HCL 1 % IJ SOLN
0.5000 mL | INTRAMUSCULAR | Status: AC | PRN
  Administered 2018-10-23: .5 mL

## 2018-10-23 MED ORDER — METHYLPREDNISOLONE ACETATE 40 MG/ML IJ SUSP
13.3300 mg | INTRAMUSCULAR | Status: AC | PRN
  Administered 2018-10-23: 13.33 mg

## 2018-10-23 NOTE — Progress Notes (Signed)
Procedure Note  Patient: Brandon Franco             Date of Birth: 14-Dec-1960           MRN: [id number]             Visit Date: 10/23/2018 HPI: [name] comes in today with new complaint of right knee pain and bilateral heel pain left greater than right.  He states that he has had right heel pain for at least the past few months.  States is worse after tennis.  Some days it is really bad with the first step of the day.  He has had no treatment for this.  He has tried some stretching on his own.  He has nonflexible orthotics in both shoes.  He does try to change his shoes out daily.  Denies any open back shoes.  In regards to his right knee he has had pain in the right knee for past 3 weeks.  States he noticed it after playing tennis.  He notes some giving way particularly with steps on the right knee.  He has seldom locking since he feels like the knee locks out in full extension.  He said no painful popping.  No swelling in the knee.  Said no treatment for the knee either.  Review of systems: See HPI otherwise negative or noncontributory.  Physical exam: General: Well-developed well-nourished male no acute distress mood affect appropriate. Psych: Alert and oriented x 3 Bilateral knees: Full range of motion bilateral knees.  Nontender along medial lateral joint line no instability of either knee.  Slight effusion of the right knee.  No abnormal warmth erythema.  McMurray's negative bilaterally. Bilateral feet: Dorsal pedal pulses are intact.  No rashes skin lesions ulcerations of either foot.  Nontender over the posterior tibial tendons peroneal tendons bilaterally.  Tenderness over the medial tubercle of calcaneus bilaterally.  Tight hamstring on the left.  Achilles intact bilaterally and nontender.  5 out of 5 strength with inversion eversion against resistance bilateral ankles.  Radiographs: Right knee: Mild to moderate arthritic changes tricompartmentally.  No acute fractures bony  abnormalities otherwise.  Knee is well located.  Procedures: Visit Diagnoses:  1. Chronic pain of right knee   2. Plantar fasciitis, left     Large Joint Inj: R knee on 10/23/2018 2:57 PM Indications: pain Details: 22 G 1.5 in needle, superolateral approach  Arthrogram: No  Medications: 3 mL lidocaine 1 %; 40 mg methylPREDNISolone acetate 40 MG/ML Aspirate: 8 mL yellow and blood-tinged Outcome: tolerated well, no immediate complications Procedure, treatment alternatives, risks and benefits explained, specific risks discussed. Consent was given by the patient. Immediately prior to procedure a time out was called to verify the correct patient, procedure, equipment, support staff and site/side marked as required. Patient was prepped and draped in the usual sterile fashion.   Foot Inj  Date/Time: 10/23/2018 2:58 PM Performed by: Pete Pelt, PA-C Authorized by: Pete Pelt, PA-C   Consent Given by:  Patient Site marked: the procedure site was marked   Timeout: prior to procedure the correct patient, procedure, and site was verified   Indications:  Fasciitis Condition: Plantar Fasciitis   Location: left plantar fascia muscle   Needle Size:  25 G Medications:  0.5 mL lidocaine 1 %; 13.33 mg methylPREDNISolone acetate 40 MG/ML  Plan: Discussed with him shoewear at length.  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  Offered formal physical therapy defers.  In regards to his  right knee we will see how he does with the cortisone injection pain persist or if it returns he will follow-up with [place].  He understands that he needs to wait at least 3 months between injections.  Questions encouraged and answered.

## 2018-11-29 ENCOUNTER — Other Ambulatory Visit: Payer: Self-pay | Admitting: Gastroenterology

## 2018-12-09 ENCOUNTER — Telehealth: Payer: Self-pay | Admitting: Internal Medicine

## 2018-12-09 MED ORDER — OMEPRAZOLE 20 MG PO CPDR: DELAYED_RELEASE_CAPSULE | ORAL | 0 refills | Status: DC

## 2018-12-09 NOTE — Telephone Encounter (Signed)
Omeprazole sent to [facility]

## 2018-12-11 ENCOUNTER — Ambulatory Visit: Payer: BC Managed Care – PPO | Admitting: Orthopaedic Surgery

## 2018-12-17 ENCOUNTER — Ambulatory Visit (INDEPENDENT_AMBULATORY_CARE_PROVIDER_SITE_OTHER): Payer: BC Managed Care – PPO | Admitting: Orthopaedic Surgery

## 2018-12-17 ENCOUNTER — Encounter: Payer: Self-pay | Admitting: Orthopaedic Surgery

## 2018-12-17 ENCOUNTER — Ambulatory Visit: Payer: Self-pay

## 2018-12-17 ENCOUNTER — Telehealth: Payer: Self-pay

## 2018-12-17 DIAGNOSIS — M25562 Pain in left knee: Secondary | ICD-10-CM | POA: Diagnosis not present

## 2018-12-17 DIAGNOSIS — M1712 Unilateral primary osteoarthritis, left knee: Secondary | ICD-10-CM | POA: Diagnosis not present

## 2018-12-17 MED ORDER — METHYLPREDNISOLONE ACETATE 40 MG/ML IJ SUSP
40.0000 mg | INTRAMUSCULAR | Status: AC | PRN
  Administered 2018-12-17: 40 mg via INTRA_ARTICULAR

## 2018-12-17 MED ORDER — LIDOCAINE HCL 1 % IJ SOLN
3.0000 mL | INTRAMUSCULAR | Status: AC | PRN
  Administered 2018-12-17: 3 mL

## 2018-12-17 NOTE — Progress Notes (Signed)
Office Visit Note   Patient: Brandon Franco           Date of Birth: 24-Sep-1960           MRN: [id number] Visit Date: 12/17/2018              Requested by: Arman Bogus., MD [place],  [place] [address] PCP: Arman Bogus., MD   Assessment & Plan: Visit Diagnoses:  1. Acute pain of left knee   2. Unilateral primary osteoarthritis, left knee     Plan: I spoke with him in detail about his knee.  I do feel it is worth trying a steroid injection today and is the perfect candidate for hyaluronic acid and that need to place in his knee in a month from now.  I explained the rationale behind this as well as the risk and benefits involved injections.  He would like to try this treatment plan.  I did show him quad strengthening exercises I want him to try.  He will avoid exercise walks that involve going up and down hills and try to back off from sports for now until he rest his knee adequately.  All question concerns were answered addressed.  Hopefully we will see him back in 4 weeks to place hyaluronic acid into the left knee.  Follow-Up Instructions: Return in about 4 weeks (around 01/14/2019).   Orders:  Orders Placed This Encounter  Procedures  . Large Joint Inj  . XR Knee 1-2 Views Left   No orders of the defined types were placed in this encounter.     Procedures: Large Joint Inj: L knee on 12/17/2018 8:41 AM Indications: diagnostic evaluation and pain Details: 22 G 1.5 in needle, superolateral approach  Arthrogram: No  Medications: 3 mL lidocaine 1 %; 40 mg methylPREDNISolone acetate 40 MG/ML Outcome: tolerated well, no immediate complications Procedure, treatment alternatives, risks and benefits explained, specific risks discussed. Consent was given by the patient. Immediately prior to procedure a time out was called to verify the correct patient, procedure, equipment, support staff and site/side marked as required. Patient was prepped and draped  in the usual sterile fashion.       Clinical Data: No additional findings.   Subjective: Chief Complaint  Patient presents with  . Left Knee - Pain  The patient is a very pleasant and active 58 year old patient of ours that we have seen before.  He does have a history of a hip replacement.  He has been dealing with bilateral knee pain and actually had an injection in his right knee in July.  The left knee has been swelling quite a bit recently.  It hurts with going up and down stairs as well.  He denies any locking and catching.  It is not swollen today but he says is a day goes on it gets more swollen.  He does get some popping in that knee but no instability symptoms.  HPI  Review of Systems He currently denies any headache, chest pain, shortness of breath, fever, chills, nausea, vomiting  Objective: Vital Signs: There were no vitals taken for this visit.  Physical Exam He is alert and orient x3 in no acute distress Ortho Exam Examination of his left knee does show just a slight effusion comparing the left and right knees and is only slightly warm.  The knee has full range of motion and is ligamentously stable.  There is medial and lateral joint line tenderness and  some patellofemoral crepitation.  His range of motion is full.  His McMurray's exam is negative. Specialty Comments:  No specialty comments available.  Imaging: Xr Knee 1-2 Views Left  Result Date: 12/17/2018 2 views of the left knee show tricompartment arthritic changes with medial and patellofemoral joint narrowing.  There is slight varus malalignment.    PMFS History: Patient Active Problem List   Diagnosis Date Noted  . Unilateral primary osteoarthritis, left knee 12/17/2018  . Gastroesophageal reflux disease 12/07/2016  . Elevated LFTs 12/07/2016  . Acute pain of right knee 09/27/2016  . Chronic bilateral low back pain without sciatica 09/27/2016  . Degenerative arthritis of hip 04/26/2012   Past  Medical History:  Diagnosis Date  . Arthritis   . Chronic kidney disease    kidney stone  . Depression   . GERD (gastroesophageal reflux disease)   . History of kidney stones   . Hypertension   . IBS (irritable bowel syndrome)   . Prostate cancer ([place])   . Right bundle branch block     Family History  Problem Relation Age of Onset  . Colon cancer Paternal Uncle        passed away age 81  . Heart disease Mother   . Hypertension Father     Past Surgical History:  Procedure Laterality Date  . APPENDECTOMY    . COLONOSCOPY    . HERNIA REPAIR    . PROSTATECTOMY  2016  . TOTAL HIP ARTHROPLASTY  04/26/2012   Procedure: TOTAL HIP ARTHROPLASTY ANTERIOR APPROACH;  Surgeon: Mcarthur Rossetti, MD;  Location: [facility];  Service: Orthopedics;  Laterality: Right;  Right Total Hip Arthroplasty   Social History   Occupational History  . Not on file  Tobacco Use  . Smoking status: Never Smoker  . Smokeless tobacco: Never Used  Substance and Sexual Activity  . Alcohol use: Yes    Alcohol/week: 1.0 standard drinks    Types: 1 Shots of liquor per week    Comment: socially- occasional  . Drug use: No  . Sexual activity: Not on file

## 2018-12-17 NOTE — Telephone Encounter (Signed)
Left knee gel injection ?

## 2018-12-20 NOTE — Telephone Encounter (Signed)
Noted  

## 2018-12-24 ENCOUNTER — Telehealth: Payer: Self-pay

## 2018-12-24 NOTE — Telephone Encounter (Signed)
Submitted VOB for SynviscOne, left knee. 

## 2019-01-02 ENCOUNTER — Telehealth: Payer: Self-pay

## 2019-01-02 NOTE — Telephone Encounter (Signed)
Approved for SynviscOne, left knee. Buy & Bill Covered at 100% through his insurance after Co-pay Co-pay of $60.00 required No PA required  Appt. 01/14/2019 with Dr. Ninfa Linden

## 2019-01-09 ENCOUNTER — Other Ambulatory Visit: Payer: Self-pay

## 2019-01-09 ENCOUNTER — Encounter: Payer: Self-pay | Admitting: Internal Medicine

## 2019-01-09 ENCOUNTER — Ambulatory Visit (INDEPENDENT_AMBULATORY_CARE_PROVIDER_SITE_OTHER): Payer: BC Managed Care – PPO | Admitting: Internal Medicine

## 2019-01-09 VITALS — BP 118/80 | HR 90 | Temp 98.3°F | Ht 73.0 in | Wt 262.0 lb

## 2019-01-09 DIAGNOSIS — K219 Gastro-esophageal reflux disease without esophagitis: Secondary | ICD-10-CM | POA: Diagnosis not present

## 2019-01-09 MED ORDER — OMEPRAZOLE 20 MG PO CPDR: DELAYED_RELEASE_CAPSULE | ORAL | 3 refills | Status: DC

## 2019-01-09 NOTE — Progress Notes (Signed)
HISTORY OF PRESENT ILLNESS:  Zaviyar Virella is a 58 y.o. male, retired Research officer, political party, who presents today for follow-up regarding management of his chronic GERD.  The patient was last seen in this office December 07, 2016.  He did undergo upper endoscopy May 25, 2015.  The examination was normal with a small hiatal hernia noted.  No Barrett's.  Since that time he has been on omeprazole 20 mg daily.  Medication controls his symptoms well.  No appreciable medication side effects.  No breakthrough symptoms.  No dysphasia.  He does request medication refill.  GI review of systems is otherwise remarkable for chronic but stable irregular bowel habits.  He did undergo complete colonoscopy November 2013.  He was noted to have mild sigmoid diverticulosis and an incidental cecal AVM.  Otherwise normal exam.  Follow-up in 10 years recommended.  REVIEW OF SYSTEMS:  All non-GI ROS negative unless otherwise stated in the HPI except for arthritis  Past Medical History:  Diagnosis Date  . Arthritis   . Chronic kidney disease    kidney stone  . Depression   . GERD (gastroesophageal reflux disease)   . History of kidney stones   . Hypertension   . IBS (irritable bowel syndrome)   . Prostate cancer ([place])   . Right bundle branch block     Past Surgical History:  Procedure Laterality Date  . APPENDECTOMY    . COLONOSCOPY    . HERNIA REPAIR    . PROSTATECTOMY  2016  . TOTAL HIP ARTHROPLASTY  04/26/2012   Procedure: TOTAL HIP ARTHROPLASTY ANTERIOR APPROACH;  Surgeon: Mcarthur Rossetti, MD;  Location: [facility];  Service: Orthopedics;  Laterality: Right;  Right Total Hip Arthroplasty    Social History Yoskar Bilinski  reports that he has never smoked. He has never used smokeless tobacco. He reports current alcohol use of about 1.0 standard drinks of alcohol per week. He reports that he does not use drugs.  family history includes Colon cancer in his paternal uncle; Heart disease in his mother;  Hypertension in his father.  No Known Allergies     PHYSICAL EXAMINATION: Vital signs: BP 118/80   Pulse 90   Temp 98.3 F (36.8 C)   Ht 6\' 1"  (1.854 m)   Wt 262 lb (118.8 kg)   BMI 34.57 kg/m   Constitutional: generally well-appearing, no acute distress Psychiatric: alert and oriented x3, cooperative Eyes: extraocular movements intact, anicteric, conjunctiva pink Mouth: oral pharynx moist, no lesions Neck: supple no lymphadenopathy Cardiovascular: heart regular rate and rhythm, no murmur Lungs: clear to auscultation bilaterally Abdomen: soft, nontender, nondistended, no obvious ascites, no peritoneal signs, normal bowel sounds, no organomegaly Rectal: Omitted Extremities: no clubbing, cyanosis, or lower extremity edema bilaterally Skin: no lesions on visible extremities Neuro: No focal deficits.  Cranial nerves intact  ASSESSMENT:  1.  Chronic GERD.  Normal upper endoscopy February 2017.  Symptoms well controlled on PPI.  Tolerating medication well without side effects 2.  Screening colonoscopy November 2013.  Diverticulosis.  Incidental cecal AVM.  No neoplasia 3.  History of irritable bowel [facility])  PLAN:  1.  Reflux precautions 2.  Refill omeprazole 20 mg daily.  Multiple refills 3.  Medication risks reviewed. 4.  Routine office follow-up 2 years 5.  Due for repeat screening colonoscopy around 2023

## 2019-01-09 NOTE — Patient Instructions (Signed)
We have sent the following medications to your pharmacy for you to pick up at your convenience:  Omeprazole  Please follow up with Dr. Henrene Pastor in two years

## 2019-01-14 ENCOUNTER — Ambulatory Visit (INDEPENDENT_AMBULATORY_CARE_PROVIDER_SITE_OTHER): Payer: BC Managed Care – PPO | Admitting: Orthopaedic Surgery

## 2019-01-14 ENCOUNTER — Encounter: Payer: Self-pay | Admitting: Orthopaedic Surgery

## 2019-01-14 ENCOUNTER — Other Ambulatory Visit: Payer: Self-pay

## 2019-01-14 DIAGNOSIS — M1712 Unilateral primary osteoarthritis, left knee: Secondary | ICD-10-CM | POA: Diagnosis not present

## 2019-01-14 MED ORDER — HYLAN G-F 20 48 MG/6ML IX SOSY
48.0000 mg | PREFILLED_SYRINGE | INTRA_ARTICULAR | Status: AC | PRN
  Administered 2019-01-14: 48 mg via INTRA_ARTICULAR

## 2019-01-14 MED ORDER — LIDOCAINE HCL 1 % IJ SOLN
3.0000 mL | INTRAMUSCULAR | Status: AC | PRN
  Administered 2019-01-14: 3 mL

## 2019-01-14 NOTE — Progress Notes (Signed)
   Procedure Note  Patient: Brandon Franco             Date of Birth: [birth year]           MRN: [id number]             Visit Date: 01/14/2019  HPI: Brandon Franco returns today for Synvisc 1 injection left knee.  States he has had swelling pain in the right knee.  He has had no new injury.  States he still having pain in his right knee which he has had a cortisone injection in the past before.  Physical exam: Left knee positive effusion.  No abnormal warmth erythema.  Good range of motion knee otherwise.  No instability.  Procedures: Visit Diagnoses:  1. Unilateral primary osteoarthritis, left knee     Large Joint Inj: L knee on 01/14/2019 9:56 AM Indications: pain Details: 18 G 1.5 in needle, superolateral approach  Arthrogram: No  Medications: 3 mL lidocaine 1 %; 48 mg Hylan 48 MG/6ML Aspirate: 35 mL blood-tinged Outcome: tolerated well, no immediate complications Procedure, treatment alternatives, risks and benefits explained, specific risks discussed. Consent was given by the patient. Immediately prior to procedure a time out was called to verify the correct patient, procedure, equipment, support staff and site/side marked as required. Patient was prepped and draped in the usual sterile fashion.     Plan: He will follow-up with [place] in 6 weeks to check his response to the injection aspiration of the knee.  He may benefit from a supplemental injection in the right knee.  Questions encouraged and answered at length.  He understands to wait at least 6 months between supplemental injections and 3 months between cortisone injections in the knees.

## 2019-02-27 ENCOUNTER — Other Ambulatory Visit: Payer: Self-pay

## 2019-02-27 ENCOUNTER — Ambulatory Visit (INDEPENDENT_AMBULATORY_CARE_PROVIDER_SITE_OTHER): Payer: BC Managed Care – PPO | Admitting: Orthopaedic Surgery

## 2019-02-27 ENCOUNTER — Encounter: Payer: Self-pay | Admitting: Orthopaedic Surgery

## 2019-02-27 DIAGNOSIS — M1712 Unilateral primary osteoarthritis, left knee: Secondary | ICD-10-CM

## 2019-02-27 NOTE — Progress Notes (Signed)
Brandon Franco is now about 7 weeks out from a hyaluronic acid injection with Synvisc 1 into the left knee to treat the pain from osteoarthritis.  He is very active 75.-year-old and is very active in his tennis.  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  He says the biggest problem is going up stairs.  He does feel the leg the injection is helpful.  He still has some pain but is better overall.  He has been working on Forensic scientist exercises as well.  On examination of his left knee it is ligamentously stable with full range of motion.  There is no significant pain today.  I talked to him in length about taking care of his knee in terms of the things he would need to avoid.  He does have a recumbent bike and elliptical at home and I do feel this will help him with his strengthening without harming the knee joint.  He can get back to playing tennis as he is comfortable.  He understands that he can always have repeat injections at a later date if needed.  All question concerns were answered and addressed.  Follow-up is as needed.

## 2019-06-14 IMAGING — US US ABDOMEN LIMITED
1 series · 14 of 25 positions shown · non-contrast
Comparison: None.

CLINICAL DATA: Elevated LFTs

EXAM:
ULTRASOUND ABDOMEN LIMITED RIGHT UPPER QUADRANT

[Series 1: us abdomen limited · 0.26mm/px · 44 acquisitions, 14 frames shown]
[im 1/44]
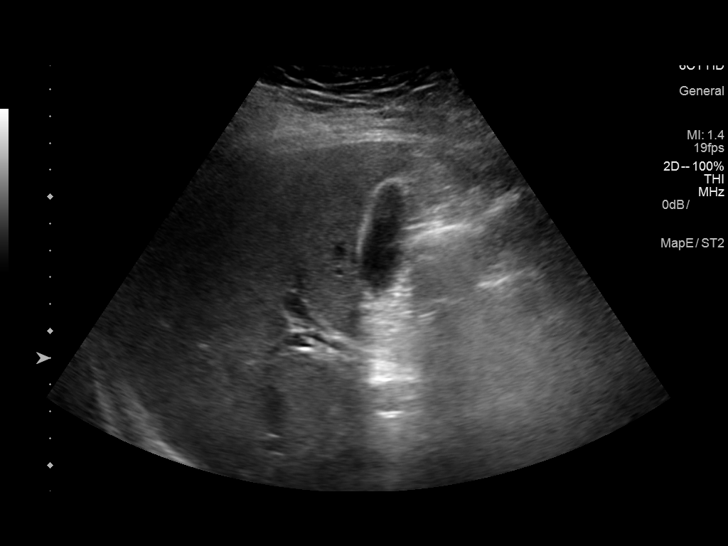
[im 4/44]
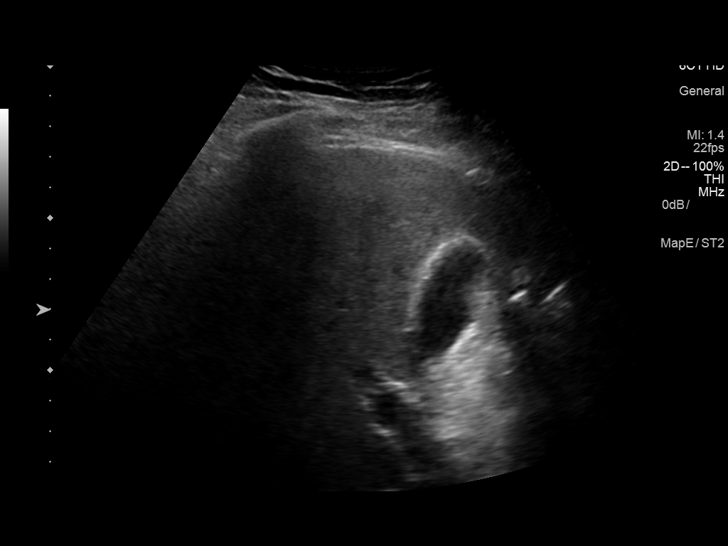
[im 8/44]
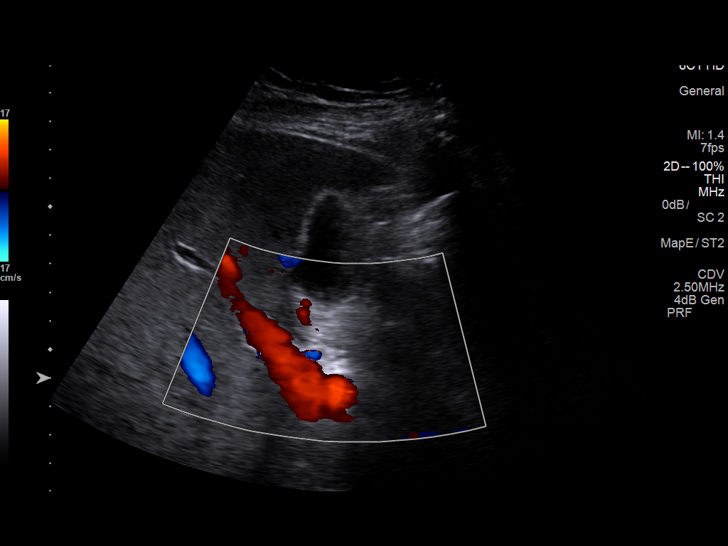
[im 11/44]
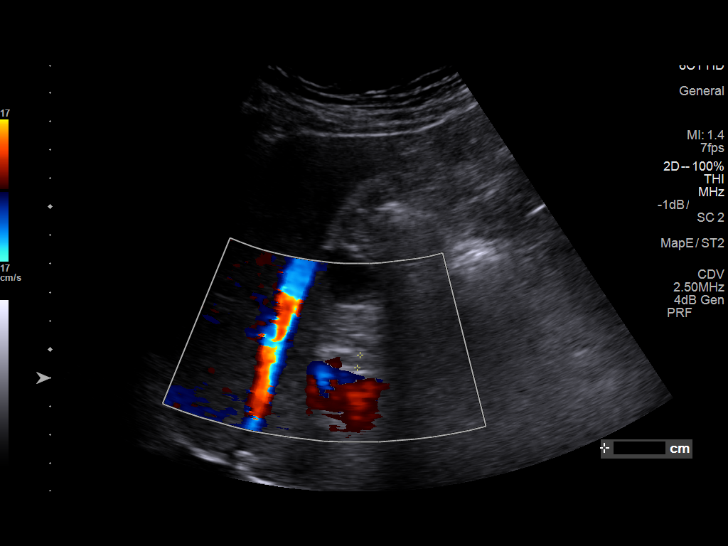
[im 15/44]
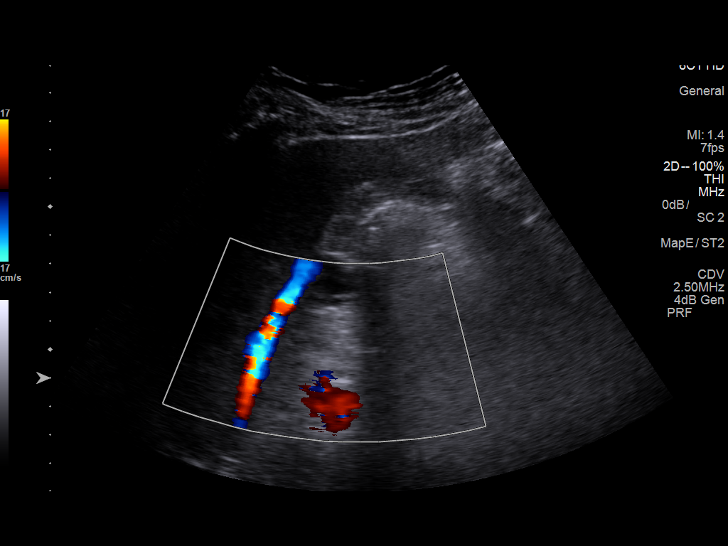
[im 17/44]
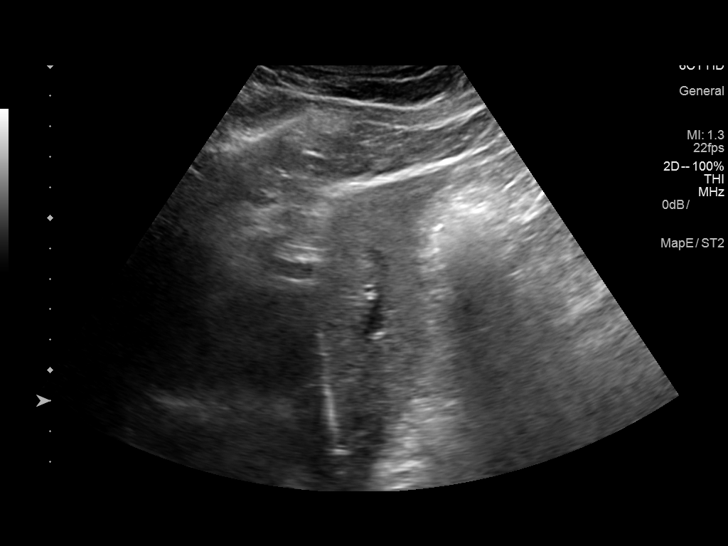
[im 20/44]
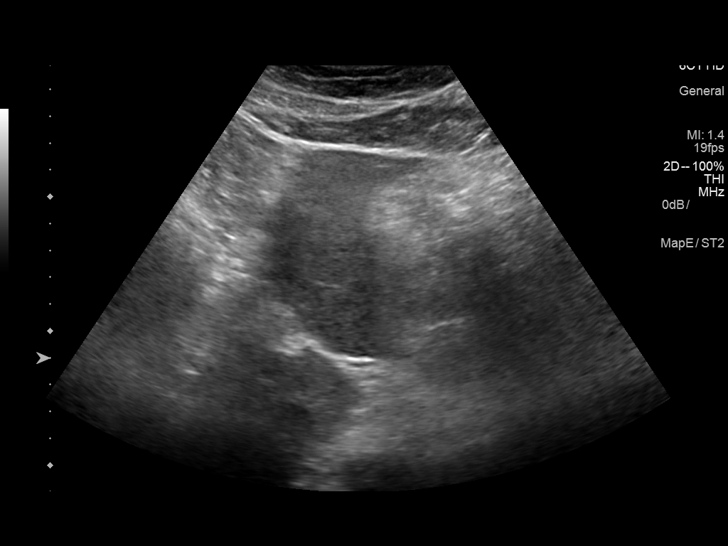
[im 24/44]
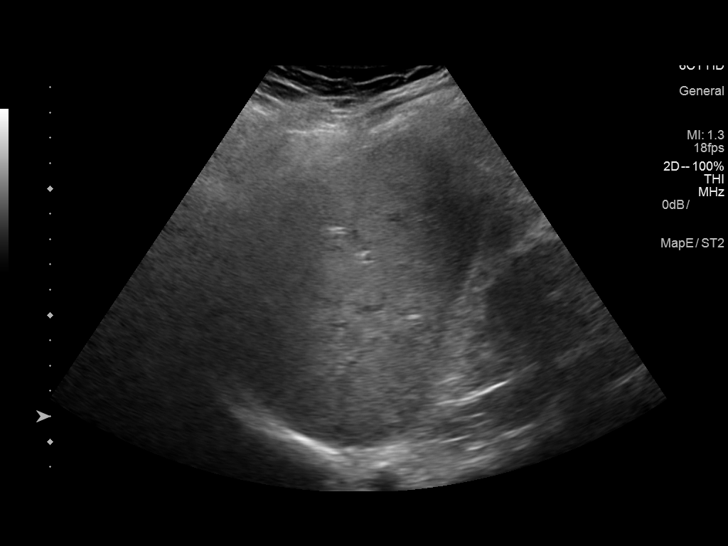
[im 27/44]
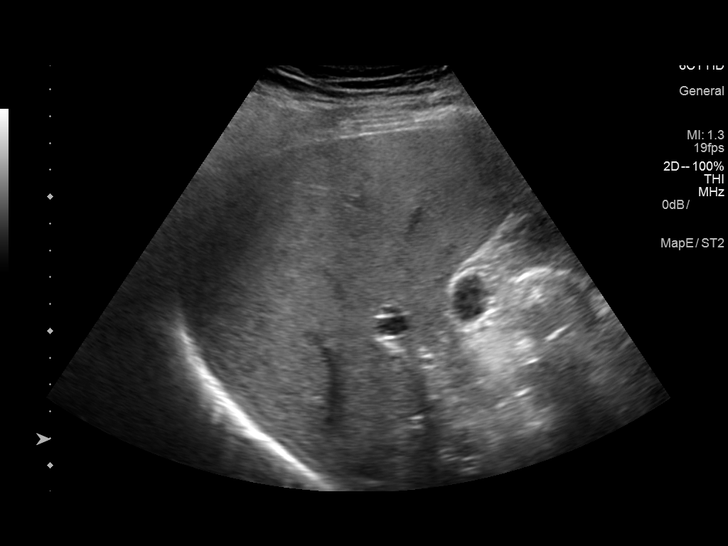
[im 29/44]
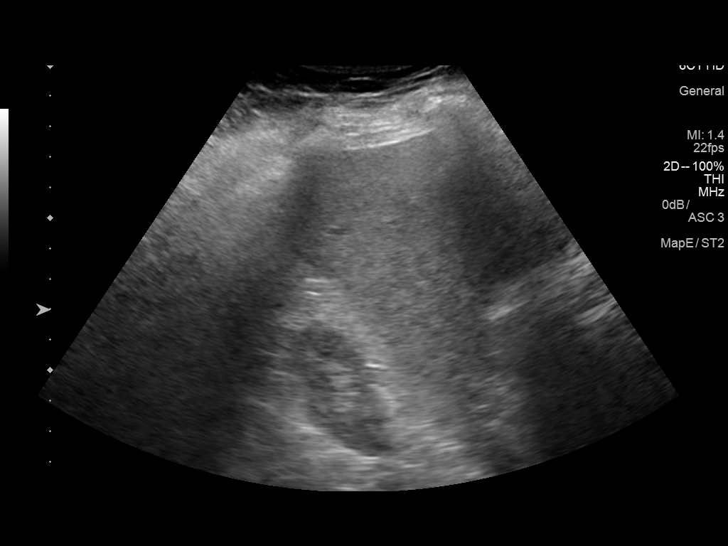
[im 33/44]
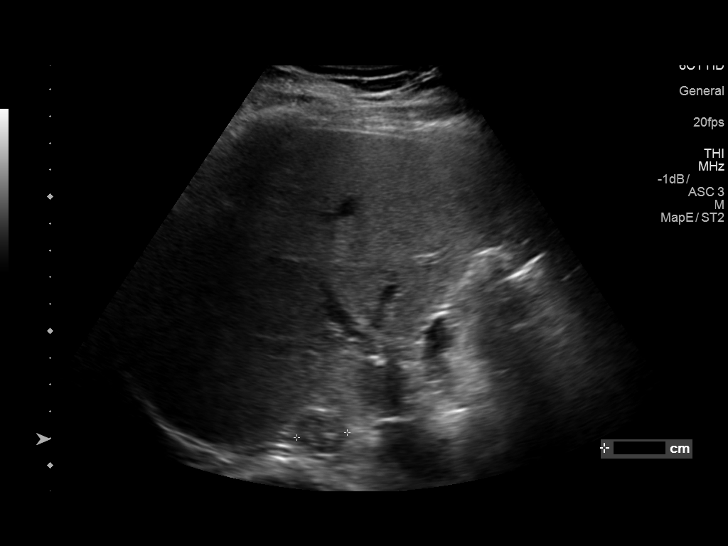
[im 36/44]
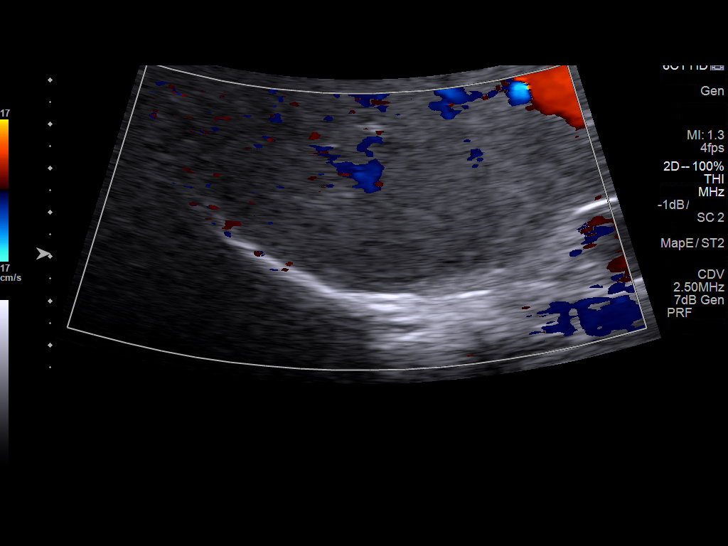
[im 40/44]
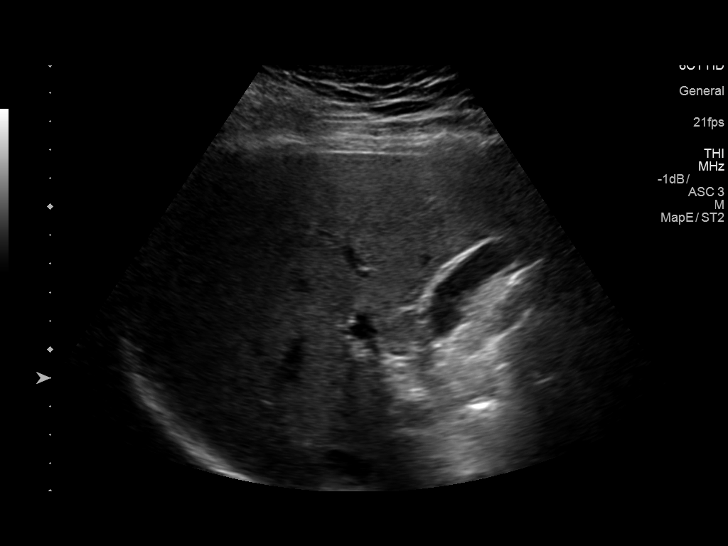
[im 44/44]
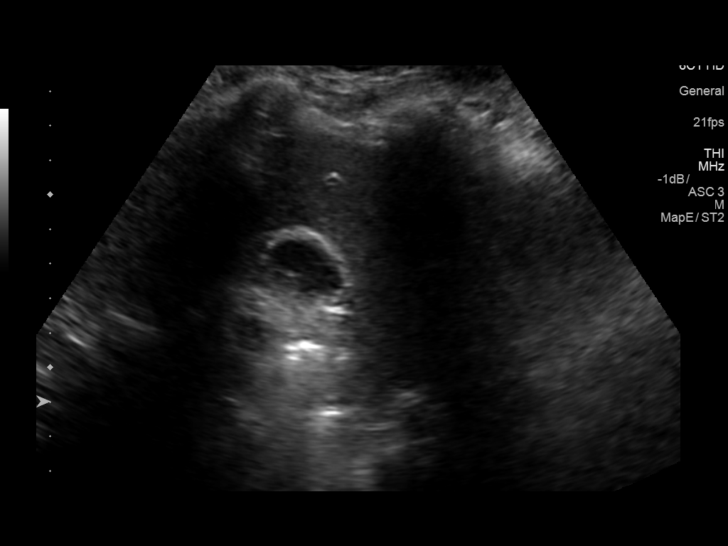

[14 of 25 positions shown; findings below may reference images not displayed]

FINDINGS: Gallbladder:

Partially contracted. A few small polyps are seen. No definitive
stones are noted.

Common bile duct:

Diameter: 5 mm

Liver:

Mildly increased in echogenicity. A a focus of similar echogenicity
is noted adjacent to the right lobe of the liver. This may arise
from the liver although is incompletely evaluated on this exam. It
measures 1.9 cm. Normal hepatopetal flow is noted in the portal
vein.
IMPRESSION: 1.9 cm relatively similar echogenic focus adjacent to the right lobe
of the liver. This likely is exophytic from the liver although the
possibility of a small right adrenal lesion could not be totally
excluded. CT of the abdomen is recommended for further evaluation.

Gallbladder polyps.

## 2019-07-28 ENCOUNTER — Ambulatory Visit (INDEPENDENT_AMBULATORY_CARE_PROVIDER_SITE_OTHER): Payer: BC Managed Care – PPO | Admitting: Orthopaedic Surgery

## 2019-07-28 ENCOUNTER — Other Ambulatory Visit: Payer: Self-pay

## 2019-07-28 DIAGNOSIS — M25561 Pain in right knee: Secondary | ICD-10-CM | POA: Diagnosis not present

## 2019-07-28 DIAGNOSIS — M1712 Unilateral primary osteoarthritis, left knee: Secondary | ICD-10-CM | POA: Diagnosis not present

## 2019-07-28 DIAGNOSIS — G8929 Other chronic pain: Secondary | ICD-10-CM | POA: Diagnosis not present

## 2019-07-28 DIAGNOSIS — M25562 Pain in left knee: Secondary | ICD-10-CM

## 2019-07-28 MED ORDER — LIDOCAINE HCL 1 % IJ SOLN
3.0000 mL | INTRAMUSCULAR | Status: AC | PRN
  Administered 2019-07-28: 3 mL

## 2019-07-28 MED ORDER — METHYLPREDNISOLONE ACETATE 40 MG/ML IJ SUSP
40.0000 mg | INTRAMUSCULAR | Status: AC | PRN
  Administered 2019-07-28: 40 mg via INTRA_ARTICULAR

## 2019-07-28 NOTE — Progress Notes (Signed)
Office Visit Note   Patient: Brandon Franco           Date of Birth: [birth year]           MRN: [id number] Visit Date: 07/28/2019              Requested by: Arman Bogus., MD [place],  [place] [address] PCP: Arman Bogus., MD   Assessment & Plan: Visit Diagnoses:  1. Chronic pain of left knee   2. Unilateral primary osteoarthritis, left knee   3. Chronic pain of right knee     Plan: At this point I do feel a MRI is warranted of the left knee to better assess the cartilage and the meniscus at the medial compartment of his knee so we can make a better treatment plan in terms of an arthroscopic intervention versus some type of knee arthroplasty.  I did feel it was appropriate to place steroid injections in both knees today since he is getting ready to travel a great distance.  We will see him back in 4 weeks hopefully by then he will have had a MRI of his left knee.  All questions and concerns were answered and addressed.  Follow-Up Instructions: Return in about 4 weeks (around 08/25/2019).   Orders:  Orders Placed This Encounter  Procedures  . Large Joint Inj: R knee  . Large Joint Inj: L knee   No orders of the defined types were placed in this encounter.     Procedures: Large Joint Inj: R knee on 07/28/2019 2:51 PM Indications: diagnostic evaluation and pain Details: 22 G 1.5 in needle, superolateral approach  Arthrogram: No  Medications: 3 mL lidocaine 1 %; 40 mg methylPREDNISolone acetate 40 MG/ML Outcome: tolerated well, no immediate complications Procedure, treatment alternatives, risks and benefits explained, specific risks discussed. Consent was given by the patient. Immediately prior to procedure a time out was called to verify the correct patient, procedure, equipment, support staff and site/side marked as required. Patient was prepped and draped in the usual sterile fashion.   Large Joint Inj: L knee on 07/28/2019 2:52  PM Indications: diagnostic evaluation and pain Details: 22 G 1.5 in needle, superolateral approach  Arthrogram: No  Medications: 3 mL lidocaine 1 %; 40 mg methylPREDNISolone acetate 40 MG/ML Outcome: tolerated well, no immediate complications Procedure, treatment alternatives, risks and benefits explained, specific risks discussed. Consent was given by the patient. Immediately prior to procedure a time out was called to verify the correct patient, procedure, equipment, support staff and site/side marked as required. Patient was prepped and draped in the usual sterile fashion.       Clinical Data: No additional findings.   Subjective: Chief Complaint  Patient presents with  . Left Knee - Pain  . Right Knee - Pain  The patient is well-known to me.  He has been dealing with bilateral knee pain for some time now but the left knee has been more chronic.  The left knee hurts mainly at the medial joint line.  He has been having locking and catching as well.  He is requesting steroid injections in both knees today but the left knee is becoming significantly worse in terms of how it is detrimentally affecting his mobility, his quality of life and his actives daily living.  He has had no other acute changes in medical status.  HPI  Review of Systems He currently denies any headache, chest pain, shortness of breath, fever, chills, nausea,  vomiting  Objective: Vital Signs: There were no vitals taken for this visit.  Physical Exam He is alert and orient x3 and in no acute distress Ortho Exam Examination of the left knee shows varus malalignment that is easily correctable.  There is significant patellofemoral crepitation when I put his knee through flexion and extension and this does cause pain.  He does have a positive McMurray sign to the medial compartment the knee and significant medial joint line tenderness.  The right knee just has a little bit of pain on exam but no gross findings.   There is no effusion of either knee. Specialty Comments:  No specialty comments available.  Imaging: No results found.   PMFS History: Patient Active Problem List   Diagnosis Date Noted  . Unilateral primary osteoarthritis, left knee 12/17/2018  . Gastroesophageal reflux disease 12/07/2016  . Elevated LFTs 12/07/2016  . Acute pain of right knee 09/27/2016  . Chronic bilateral low back pain without sciatica 09/27/2016  . Degenerative arthritis of hip 04/26/2012   Past Medical History:  Diagnosis Date  . Arthritis   . Chronic kidney disease    kidney stone  . Depression   . GERD (gastroesophageal reflux disease)   . History of kidney stones   . Hypertension   . IBS (irritable bowel syndrome)   . Prostate cancer ([place])   . Right bundle branch block     Family History  Problem Relation Age of Onset  . Colon cancer Paternal Uncle        passed away age 30  . Heart disease Mother   . Hypertension Father     Past Surgical History:  Procedure Laterality Date  . APPENDECTOMY    . COLONOSCOPY    . HERNIA REPAIR    . PROSTATECTOMY  2016  . TOTAL HIP ARTHROPLASTY  04/26/2012   Procedure: TOTAL HIP ARTHROPLASTY ANTERIOR APPROACH;  Surgeon: Mcarthur Rossetti, MD;  Location: [facility];  Service: Orthopedics;  Laterality: Right;  Right Total Hip Arthroplasty   Social History   Occupational History  . Not on file  Tobacco Use  . Smoking status: Never Smoker  . Smokeless tobacco: Never Used  Substance and Sexual Activity  . Alcohol use: Yes    Alcohol/week: 1.0 standard drinks    Types: 1 Shots of liquor per week    Comment: socially- occasional  . Drug use: No  . Sexual activity: Not on file

## 2019-08-12 ENCOUNTER — Encounter: Payer: Self-pay | Admitting: Orthopaedic Surgery

## 2019-08-12 ENCOUNTER — Ambulatory Visit (INDEPENDENT_AMBULATORY_CARE_PROVIDER_SITE_OTHER): Payer: BC Managed Care – PPO | Admitting: Orthopaedic Surgery

## 2019-08-12 ENCOUNTER — Other Ambulatory Visit: Payer: Self-pay

## 2019-08-12 DIAGNOSIS — M25561 Pain in right knee: Secondary | ICD-10-CM

## 2019-08-12 NOTE — Progress Notes (Signed)
HPI: Brandon Franco returns today due to right knee painful popping while hiking last Tuesday.  States most the pain lateral aspect.  Severe painful popping lateral aspect of the right knee.  Initially unable to bend the knee had shooting pain had trouble even lifting the leg.  1 week ago was unable to weight-bear.  He was out in [place] when this happened visiting his parents.  Central going up and down stairs.  He has had no further mechanical symptoms.  No significant swelling.  He is scheduled for a left knee MRI this coming Saturday.  He states he is unsure if the injections helped either knee.  Review of systems: Please see HPI otherwise negative  Physical exam: Right knee no abnormal warmth erythema or effusion.  Tenderness along lateral joint line of the right knee.  No instability valgus varus stressing.  Positive McMurray's right knee.  Good range of motion of the knee otherwise.  Impression: Right knee acute pain  Plan: Recommend MRI of the right knee to rule out lateral meniscal tear.  Having follow-up after the MRI is available.  Questions were encouraged and answered by Dr. Ninfa Linden myself.

## 2019-08-16 ENCOUNTER — Ambulatory Visit (HOSPITAL_BASED_OUTPATIENT_CLINIC_OR_DEPARTMENT_OTHER)
Admission: RE | Admit: 2019-08-16 | Discharge: 2019-08-16 | Disposition: A | Discharge status: Home / Self Care | Payer: BC Managed Care – PPO | Source: Ambulatory Visit | Attending: Physician Assistant | Admitting: Physician Assistant

## 2019-08-16 ENCOUNTER — Encounter (HOSPITAL_BASED_OUTPATIENT_CLINIC_OR_DEPARTMENT_OTHER): Payer: Self-pay

## 2019-08-16 ENCOUNTER — Ambulatory Visit (HOSPITAL_BASED_OUTPATIENT_CLINIC_OR_DEPARTMENT_OTHER)
Admission: RE | Admit: 2019-08-16 | Discharge: 2019-08-16 | Disposition: A | Discharge status: Home / Self Care | Payer: BC Managed Care – PPO | Source: Ambulatory Visit | Attending: Orthopaedic Surgery | Admitting: Orthopaedic Surgery

## 2019-08-16 ENCOUNTER — Other Ambulatory Visit: Payer: Self-pay

## 2019-08-16 DIAGNOSIS — M25562 Pain in left knee: Secondary | ICD-10-CM | POA: Insufficient documentation

## 2019-08-16 DIAGNOSIS — M25561 Pain in right knee: Secondary | ICD-10-CM | POA: Diagnosis present

## 2019-08-16 DIAGNOSIS — G8929 Other chronic pain: Secondary | ICD-10-CM | POA: Insufficient documentation

## 2019-08-20 ENCOUNTER — Encounter: Payer: Self-pay | Admitting: Orthopaedic Surgery

## 2019-08-20 ENCOUNTER — Ambulatory Visit (INDEPENDENT_AMBULATORY_CARE_PROVIDER_SITE_OTHER): Payer: BC Managed Care – PPO | Admitting: Orthopaedic Surgery

## 2019-08-20 ENCOUNTER — Other Ambulatory Visit: Payer: Self-pay

## 2019-08-20 DIAGNOSIS — S83241A Other tear of medial meniscus, current injury, right knee, initial encounter: Secondary | ICD-10-CM | POA: Diagnosis not present

## 2019-08-20 NOTE — Progress Notes (Signed)
The patient comes in today to go over MRIs of both his knees.  He has been having chronic knee problems with his left knee but his right knee is more of an acute issue.  He was hiking in [place] the other week and he felt a pop in his knee and now his knee is swollen quite a bit on the right side.  He is having to wear a knee brace.  We sent him for an MRI of both knees because originally we were going to MRI just the left knee until this acute right knee injury.  His x-rays have shown just slight medial joint space narrowing but otherwise pretty good alignment knees.  Again he is having worse symptoms with his right knee locking catching and swelling a lot of medial pain.  The left knee has been more of a chronic issue with medial joint line tenderness.  Examination today does show the right knee has a moderate effusion.  The left knee has patellofemoral crepitation with no effusion.  Both knees have extensive medial joint line tenderness.  MRI of the right knee does show a area of full-thickness cartilage loss this well-circumscribed and a 1.2 x 1.2 cm area that may be a new osteochondral defect.  There is an extensive medial meniscal tear that does not appear to be degenerative.  There is also some lateral meniscal tearing and signal changes in the lateral collateral ligament suggesting acute injury.  The left knee has areas of full-thickness cartilage loss throughout the medial compartment of the knee with a small meniscal root tear.  I shared him the MRI results and he is seeing these already all through my chart.  We went over the images.  I do feel for the long-term he needs a knee replacement his left knee but in the short-term he does need to be right knee arthroscopy given the MRI findings and the mechanical symptoms.  I showed him a knee model and explained what the surgery would involve including a partial medial meniscectomy and possibly even microfracture surgery depending on her interoperative  findings.  I discussed what this means.  We talked about his interoperative and postoperative course and the risks and benefits of surgery.  He still may end up 1 day needing a knee replacement on the right knee but given his acute issues and the MRI findings I do feel a right knee arthroscopy is needed acutely on the right knee.  All questions and concerns were answered and addressed.  We will work on getting this scheduled.

## 2019-08-25 ENCOUNTER — Ambulatory Visit: Payer: BC Managed Care – PPO | Admitting: Orthopaedic Surgery

## 2019-08-28 ENCOUNTER — Other Ambulatory Visit: Payer: Self-pay | Admitting: Orthopaedic Surgery

## 2019-08-28 DIAGNOSIS — M94261 Chondromalacia, right knee: Secondary | ICD-10-CM

## 2019-08-28 DIAGNOSIS — S83231D Complex tear of medial meniscus, current injury, right knee, subsequent encounter: Secondary | ICD-10-CM

## 2019-08-28 DIAGNOSIS — S83271D Complex tear of lateral meniscus, current injury, right knee, subsequent encounter: Secondary | ICD-10-CM

## 2019-08-28 MED ORDER — HYDROCODONE-ACETAMINOPHEN 5-325 MG PO TABS: 1.0000 | ORAL_TABLET | Freq: Four times a day (QID) | ORAL | 0 refills | Status: DC | PRN

## 2019-09-04 ENCOUNTER — Ambulatory Visit (INDEPENDENT_AMBULATORY_CARE_PROVIDER_SITE_OTHER): Payer: BC Managed Care – PPO | Admitting: Physician Assistant

## 2019-09-04 ENCOUNTER — Other Ambulatory Visit: Payer: Self-pay

## 2019-09-04 ENCOUNTER — Encounter: Payer: Self-pay | Admitting: Physician Assistant

## 2019-09-04 DIAGNOSIS — Z96651 Presence of right artificial knee joint: Secondary | ICD-10-CM

## 2019-09-04 NOTE — Progress Notes (Signed)
HPI: [name] returns today status post right knee arthroscopy.  He is now 1 week status post right knee arthroscopy with medial and lateral partial meniscectomy chondroplasty medial femoral condyle lateral femoral condyle posterior surfaces of the patella.  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  He denies any fevers chills shortness of breath chest pain.  Still having pain going up and down stairs.  Physical exam: Right knee full extension flexion to at least 90 degrees.  Port sites well approximated with interrupted nylons no signs of infection.  Right calf supple nontender.  Ambulates without any assistive device.   Impression: 1 week status post right knee arthroscopy  Plan: He will work on Forensic scientist.  Discussed knee friendly exercises with him discussed other forms of conservative treatment which include repeating cortisone injection and possible supplemental injection in the knee.  However ultimately he may end up with a total knee replacement on the right side and he understands this and states that he actually is getting to terms with this.  Questions were encouraged and answered.  Follow-up in 4 weeks

## 2019-10-01 ENCOUNTER — Other Ambulatory Visit: Payer: Self-pay

## 2019-10-01 ENCOUNTER — Ambulatory Visit (INDEPENDENT_AMBULATORY_CARE_PROVIDER_SITE_OTHER): Payer: BC Managed Care – PPO | Admitting: Physician Assistant

## 2019-10-01 ENCOUNTER — Encounter: Payer: Self-pay | Admitting: Physician Assistant

## 2019-10-01 DIAGNOSIS — M1711 Unilateral primary osteoarthritis, right knee: Secondary | ICD-10-CM | POA: Insufficient documentation

## 2019-10-01 DIAGNOSIS — M1712 Unilateral primary osteoarthritis, left knee: Secondary | ICD-10-CM

## 2019-10-01 NOTE — Progress Notes (Addendum)
Office Visit Note   Patient: Brandon Franco           Date of Birth: 01/08/1961           MRN: [id number] Visit Date: 10/01/2019              Requested by: Arman Bogus., MD [place],  [place] [id number] PCP: Arman Bogus., MD   Assessment & Plan: Visit Diagnoses:  1. Primary osteoarthritis of right knee   2. Primary osteoarthritis of left knee     Plan: Due to the fact the patient is failed conservative treatment which is included time: Exercise, injections and knee arthroscopy and continues to have pain in the right knee that is affecting his activities of daily living recommend right total knee arthroplasty.  Patient like to proceed with right total knee arthroplasty in the near future.  Risk benefits of surgery discussed with patient at length.  Risk include but are not limited to nerve/vessel injury, PE/DVT, wound healing problems, infection, blood loss prolonged pain and worsening pain.  Questions were encouraged and answered by Dr. Ninfa Linden and myself.  He will follow-up with [place] 2 weeks postop. Will try to obtain approval for his left knee which he has known osteoarthritis.   Follow-Up Instructions: Return 2 weeks post-op.   Orders:  No orders of the defined types were placed in this encounter.  No orders of the defined types were placed in this encounter.     Procedures: No procedures performed   Clinical Data: No additional findings.   Subjective: Chief Complaint  Patient presents with   Right Knee - Follow-up, Pain    HPI Brandon Franco returns today follow-up of his right knee.  He states he continues to have pain in the knee.  States he cannot walk for any long distance.  Cannot dissipate in the sporting activities that he would like to be involved in.  He states he cannot tell any change since undergoing the knee arthroscopy on 08/28/2019.  Again had a right knee arthroscopy with medial lateral partial meniscectomies.  Also  chondroplasty of the medial femoral condyle, lateral femoral condyle and the patella.  He is found to have great 4 chondromalacia medial femoral condyle, grade 3 changes lateral femoral condyle and grade III chondromalacia patellofemoral joint.  Review of Systems Denies any fevers chills or infection.  Objective: Vital Signs: There were no vitals taken for this visit.  Physical Exam Pulmonary:     Effort: Pulmonary effort is normal.  Neurological:     Mental Status: He is alert and oriented to person, place, and time.  Psychiatric:        Mood and Affect: Mood normal.     Ortho Exam Right knee: Full extension flexion beyond 90 degrees.  Port sites well-healed.  Right calf supple nontender.  Obvious valgus deformity of the right knee. Specialty Comments:  No specialty comments available.  Imaging: No results found.   PMFS History: Patient Active Problem List   Diagnosis Date Noted   Primary osteoarthritis of right knee 10/01/2019   Primary osteoarthritis of left knee 10/01/2019   Unilateral primary osteoarthritis, left knee 12/17/2018   Gastroesophageal reflux disease 12/07/2016   Elevated LFTs 12/07/2016   Acute pain of right knee 09/27/2016   Chronic bilateral low back pain without sciatica 09/27/2016   Degenerative arthritis of hip 04/26/2012   Past Medical History:  Diagnosis Date   Arthritis    Chronic kidney disease  kidney stone   Depression    GERD (gastroesophageal reflux disease)    History of kidney stones    Hypertension    IBS (irritable bowel syndrome)    Prostate cancer (HCC)    Right bundle branch block     Family History  Problem Relation Age of Onset   Colon cancer Paternal Uncle        passed away age 3   Heart disease Mother    Hypertension Father     Past Surgical History:  Procedure Laterality Date   APPENDECTOMY     COLONOSCOPY     HERNIA REPAIR     PROSTATECTOMY  2016   TOTAL HIP ARTHROPLASTY   04/26/2012   Procedure: TOTAL HIP ARTHROPLASTY ANTERIOR APPROACH;  Surgeon: Mcarthur Rossetti, MD;  Location: [facility];  Service: Orthopedics;  Laterality: Right;  Right Total Hip Arthroplasty   Social History   Occupational History   Not on file  Tobacco Use   Smoking status: Never Smoker   Smokeless tobacco: Never Used  Vaping Use   Vaping Use: Never used  Substance and Sexual Activity   Alcohol use: Yes    Alcohol/week: 1.0 standard drink    Types: 1 Shots of liquor per week    Comment: socially- occasional   Drug use: No   Sexual activity: Not on file

## 2019-10-02 ENCOUNTER — Telehealth: Payer: Self-pay | Admitting: Radiology

## 2019-10-02 NOTE — Telephone Encounter (Signed)
-----   Message from Pete Pelt, PA-C sent at 10/01/2019  5:16 PM EDT ----- Needs a supplemental left knee.

## 2019-10-03 NOTE — Telephone Encounter (Signed)
Noted  

## 2019-10-15 ENCOUNTER — Telehealth: Payer: Self-pay

## 2019-10-15 NOTE — Telephone Encounter (Signed)
Submitted VOB, SynviscOne, left knee. 

## 2019-10-17 ENCOUNTER — Telehealth: Payer: Self-pay | Admitting: Orthopaedic Surgery

## 2019-10-17 ENCOUNTER — Telehealth: Payer: Self-pay

## 2019-10-17 ENCOUNTER — Other Ambulatory Visit: Payer: Self-pay

## 2019-10-17 NOTE — Telephone Encounter (Signed)
Is it cortisone or gel? Gel injections have to be ordered Cortisone injections just have to be 3 months in between

## 2019-10-17 NOTE — Telephone Encounter (Signed)
Pt called in wanting to get an injection in his left knee;pt not sure if it was a shot that had to be ordered so I saved an appt on 10/27/19 and just wanted to ask.

## 2019-10-17 NOTE — Telephone Encounter (Signed)
Called and left a VM advising patient to call back to schedule an appointment for gel injection with Dr. Ninfa Linden or Artis Delay.  Approved, SynviscOne, Left knee. [facility] Patient is covered at 100% through his insurance. No Co-pay No PA required

## 2019-10-27 ENCOUNTER — Other Ambulatory Visit: Payer: Self-pay

## 2019-10-27 ENCOUNTER — Ambulatory Visit (INDEPENDENT_AMBULATORY_CARE_PROVIDER_SITE_OTHER): Payer: BC Managed Care – PPO | Admitting: Physician Assistant

## 2019-10-27 ENCOUNTER — Encounter: Payer: Self-pay | Admitting: Physician Assistant

## 2019-10-27 DIAGNOSIS — M1712 Unilateral primary osteoarthritis, left knee: Secondary | ICD-10-CM

## 2019-10-27 MED ORDER — LIDOCAINE HCL 1 % IJ SOLN
3.0000 mL | INTRAMUSCULAR | Status: AC | PRN
  Administered 2019-10-27: 3 mL

## 2019-10-27 MED ORDER — HYLAN G-F 20 48 MG/6ML IX SOSY
48.0000 mg | PREFILLED_SYRINGE | INTRA_ARTICULAR | Status: AC | PRN
  Administered 2019-10-27: 48 mg via INTRA_ARTICULAR

## 2019-10-27 NOTE — Progress Notes (Signed)
   Procedure Note  Patient: Brandon Franco             Date of Birth: November 08, 1960           MRN: [id number]             Visit Date: 10/27/2019 HPI: Annie Main returns for left knee injection with Synvisc 1.  He states has some achiness pain in the knee particularly with activity.  He has had no new injury to the knee.  He has known osteoarthritis left knee.  Physical exam: Left knee full extension full flexion.  No abnormal warmth erythema or effusion. Procedures: Visit Diagnoses:  1. Primary osteoarthritis of left knee     Large Joint Inj: L knee on 10/27/2019 2:04 PM Indications: pain Details: 22 G 1.5 in needle, superolateral approach  Arthrogram: No  Medications: 48 mg Hylan 48 MG/6ML; 3 mL lidocaine 1 % Outcome: tolerated well, no immediate complications Procedure, treatment alternatives, risks and benefits explained, specific risks discussed. Consent was given by the patient. Immediately prior to procedure a time out was called to verify the correct patient, procedure, equipment, support staff and site/side marked as required. Patient was prepped and draped in the usual sterile fashion.     Plan: He will follow up as needed.  Understands widely 6 months between the Synvisc injections in 3 months between cortisone injections.

## 2019-11-06 NOTE — Patient Instructions (Addendum)
DUE TO COVID-19 ONLY ONE VISITOR IS ALLOWED TO COME WITH YOU AND STAY IN THE WAITING ROOM ONLY DURING PRE OP AND PROCEDURE DAY OF SURGERY. THE 1 VISITOR MAY VISIT WITH YOU AFTER SURGERY IN YOUR PRIVATE ROOM DURING VISITING HOURS ONLY!  YOU NEED TO HAVE A COVID 19 TEST ON: 11/11/19 @ 10:00 am , THIS TEST MUST BE DONE BEFORE SURGERY, COME  [place], [place] , [address].  [facility]) , COVID TESTING SITE [place] [address], IT IS ON THE RIGHT GOING OUT [address] APPROX[address] 2 MINUTES PAST [employer] ON THE RIGHT. ONCE YOUR COVID TEST IS COMPLETED,  PLEASE BEGIN THE QUARANTINE INSTRUCTIONS AS OUTLINED IN YOUR HANDOUT.                Stephens Franco   Your procedure is scheduled on: 11/14/19   Report to [facility] Main  Entrance   Report to admitting at: 6:00 AM     Call this number if you have problems the morning of surgery [phone]    Remember: Do not eat food or drink liquids :After Midnight.   BRUSH YOUR TEETH MORNING OF SURGERY AND RINSE YOUR MOUTH OUT, NO CHEWING GUM CANDY OR MINTS.     Take these medicines the morning of surgery with A SIP OF WATER: omeprazole.                                 You may not have any metal on your body including hair pins and              piercings  Do not wear jewelry, lotions, powders or perfumes, deodorant             Men may shave face and neck.   Do not bring valuables to the hospital. [place].  Contacts, dentures or bridgework may not be worn into surgery.  Leave suitcase in the car. After surgery it may be brought to your room.     Patients discharged the day of surgery will not be allowed to drive home. IF YOU ARE HAVING SURGERY AND GOING HOME THE SAME DAY, YOU MUST HAVE AN ADULT TO DRIVE YOU HOME AND BE WITH YOU FOR 24 HOURS. YOU MAY GO HOME BY TAXI OR UBER OR ORTHERWISE, BUT AN ADULT MUST ACCOMPANY YOU HOME  AND STAY WITH YOU FOR 24 HOURS.  Name and phone number of your driver:  Special Instructions: N/A              Please read over the following fact sheets you were given: _____________________________________________________________________        [facility] - Preparing for Surgery Before surgery, you can play an important role.  Because skin is not sterile, your skin needs to be as free of germs as possible.  You can reduce the number of germs on your skin by washing with CHG (chlorahexidine gluconate) soap before surgery.  CHG is an antiseptic cleaner which kills germs and bonds with the skin to continue killing germs even after washing. Please DO NOT use if you have an allergy to CHG or antibacterial soaps.  If your skin becomes reddened/irritated stop using the CHG and inform your nurse when you arrive at Short Stay. Do not shave (including  legs and underarms) for at least 48 hours prior to the first CHG shower.  You may shave your face/neck. Please follow these instructions carefully:  1.  Shower with CHG Soap the night before surgery and the  morning of Surgery.  2.  If you choose to wash your hair, wash your hair first as usual with your  normal  shampoo.  3.  After you shampoo, rinse your hair and body thoroughly to remove the  shampoo.                           4.  Use CHG as you would any other liquid soap.  You can apply chg directly  to the skin and wash                       Gently with a scrungie or clean washcloth.  5.  Apply the CHG Soap to your body ONLY FROM THE NECK DOWN.   Do not use on face/ open                           Wound or open sores. Avoid contact with eyes, ears mouth and genitals (private parts).                       Wash face,  Genitals (private parts) with your normal soap.             6.  Wash thoroughly, paying special attention to the area where your surgery  will be performed.  7.  Thoroughly rinse your body with warm water from the neck down.  8.  DO NOT  shower/wash with your normal soap after using and rinsing off  the CHG Soap.                9.  Pat yourself dry with a clean towel.            10.  Wear clean pajamas.            11.  Place clean sheets on your bed the night of your first shower and do not  sleep with pets. Day of Surgery : Do not apply any lotions/deodorants the morning of surgery.  Please wear clean clothes to the hospital/surgery center.  FAILURE TO FOLLOW THESE INSTRUCTIONS MAY RESULT IN THE CANCELLATION OF YOUR SURGERY PATIENT SIGNATURE_________________________________  NURSE SIGNATURE__________________________________  ________________________________________________________________________

## 2019-11-06 NOTE — Progress Notes (Signed)
Pt. Needs orders for the upcomming surgery.PST appointment on 12/08/19.Thanks.

## 2019-11-07 ENCOUNTER — Encounter (HOSPITAL_COMMUNITY)
Admission: RE | Admit: 2019-11-07 | Discharge: 2019-11-07 | Disposition: A | Discharge status: Home / Self Care | Payer: BC Managed Care – PPO | Source: Ambulatory Visit | Attending: Orthopaedic Surgery | Admitting: Orthopaedic Surgery

## 2019-11-07 ENCOUNTER — Encounter (HOSPITAL_COMMUNITY): Payer: Self-pay

## 2019-11-07 ENCOUNTER — Other Ambulatory Visit: Payer: Self-pay

## 2019-11-07 ENCOUNTER — Other Ambulatory Visit: Payer: Self-pay | Admitting: Physician Assistant

## 2019-11-07 DIAGNOSIS — Z01818 Encounter for other preprocedural examination: Secondary | ICD-10-CM | POA: Insufficient documentation

## 2019-11-07 LAB — CBC
HCT: 49.5 % (ref 39.0–52.0)
Hemoglobin: 16.7 g/dL (ref 13.0–17.0)
MCH: 31.3 pg (ref 26.0–34.0)
MCHC: 33.7 g/dL (ref 30.0–36.0)
MCV: 92.9 fL (ref 80.0–100.0)
Platelets: 223 10*3/uL (ref 150–400)
RBC: 5.33 MIL/uL (ref 4.22–5.81)
RDW: 13 % (ref 11.5–15.5)
WBC: 4.1 10*3/uL (ref 4.0–10.5)
nRBC: 0 % (ref 0.0–0.2)

## 2019-11-07 LAB — BASIC METABOLIC PANEL
Anion gap: 13 (ref 5–15)
BUN: 15 mg/dL (ref 6–20)
CO2: 23 mmol/L (ref 22–32)
Calcium: 9.6 mg/dL (ref 8.9–10.3)
Chloride: 104 mmol/L (ref 98–111)
Creatinine, Ser: 0.88 mg/dL (ref 0.61–1.24)
GFR calc Af Amer: >60 mL/min (ref >60)
GFR calc non Af Amer: >60 mL/min (ref >60)
Glucose, Bld: 90 mg/dL (ref 70–99)
Potassium: 4 mmol/L (ref 3.5–5.1)
Sodium: 140 mmol/L (ref 135–145)

## 2019-11-07 LAB — SURGICAL PCR SCREEN
MRSA, PCR: NEGATIVE
Staphylococcus aureus: NEGATIVE

## 2019-11-07 NOTE — Progress Notes (Signed)
COVID Vaccine Completed:yes Date COVID Vaccine completed: COVID vaccine manufacturer: Pfizer    *[employer]   PCP - Dr. Maryjean Ka. LOV: 09/01/19 Cardiologist - No.   Chest x-ray -  EKG -  Stress Test -  ECHO -  Cardiac Cath -   Sleep Study - yes CPAP - yes  Fasting Blood Sugar -  Checks Blood Sugar _____ times a day  Blood Thinner Instructions: Aspirin Instructions: RN advised pt. To call surgeon's office and ask for instructions on Aspirin 81 mg. Last Dose:  Anesthesia review: Hx: HTN,Rt. BBB,OSA(CPAP). Pt. 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 verbalized he is able to walk for a mile or more without complications,his activities are limited at the moment due to the right knee pain.  Patient denies shortness of breath, fever, cough and chest pain at PAT appointment   Patient verbalized understanding of instructions that were given to them at the PAT appointment. Patient was also instructed that they will need to review over the PAT instructions again at home before surgery.

## 2019-11-11 ENCOUNTER — Other Ambulatory Visit (HOSPITAL_COMMUNITY)
Admission: RE | Admit: 2019-11-11 | Discharge: 2019-11-11 | Disposition: A | Discharge status: Home / Self Care | Payer: BC Managed Care – PPO | Source: Ambulatory Visit | Attending: Orthopaedic Surgery | Admitting: Orthopaedic Surgery

## 2019-11-11 DIAGNOSIS — Z20822 Contact with and (suspected) exposure to covid-19: Secondary | ICD-10-CM | POA: Diagnosis not present

## 2019-11-11 DIAGNOSIS — Z01812 Encounter for preprocedural laboratory examination: Secondary | ICD-10-CM | POA: Diagnosis not present

## 2019-11-11 LAB — SARS CORONAVIRUS 2 (TAT 6-24 HRS): SARS Coronavirus 2: NEGATIVE

## 2019-11-13 NOTE — Anesthesia Preprocedure Evaluation (Addendum)
Anesthesia Evaluation  Patient identified by MRN, date of birth, ID band Patient awake    Reviewed: Allergy & Precautions, NPO status , Patient's Chart, lab work & pertinent test results  Airway Mallampati: II  TM Distance: >3 FB Neck ROM: Full    Dental no notable dental hx. (+) Teeth Intact, Dental Advisory Given   Pulmonary neg pulmonary ROS,    Pulmonary exam normal breath sounds clear to auscultation       Cardiovascular hypertension, Pt. on medications Normal cardiovascular exam Rhythm:Regular Rate:Normal  EKG RBBB   Neuro/Psych PSYCHIATRIC DISORDERS Depression negative neurological ROS     GI/Hepatic Neg liver ROS, GERD  Medicated,  Endo/Other  negative endocrine ROS  Renal/GU negative Renal ROS  negative genitourinary   Musculoskeletal  (+) Arthritis ,   Abdominal   Peds  Hematology negative hematology ROS (+)   Anesthesia Other Findings   Reproductive/Obstetrics                            Anesthesia Physical Anesthesia Plan  ASA: II  Anesthesia Plan: Spinal and Regional   Post-op Pain Management:  Regional for Post-op pain   Induction:   PONV Risk Score and Plan: 1 and Treatment may vary due to age or medical condition, Midazolam and Propofol infusion  Airway Management Planned: Natural Airway  Additional Equipment:   Intra-op Plan:   Post-operative Plan:   Informed Consent: I have reviewed the patients History and Physical, chart, labs and discussed the procedure including the risks, benefits and alternatives for the proposed anesthesia with the patient or authorized representative who has indicated his/her understanding and acceptance.     Dental advisory given  Plan Discussed with: CRNA  Anesthesia Plan Comments:         Anesthesia Quick Evaluation

## 2019-11-13 NOTE — H&P (Signed)
TOTAL KNEE ADMISSION H&P  Patient is being admitted for right total knee arthroplasty.  Subjective:  Chief Complaint:right knee pain.  HPI: Brandon Franco, 59 y.o. male, has a history of pain and functional disability in the right knee due to arthritis and has failed non-surgical conservative treatments for greater than 12 weeks to includeNSAID's and/or analgesics, corticosteriod injections, viscosupplementation injections, flexibility and strengthening excercises, weight reduction as appropriate and activity modification.  Onset of symptoms was gradual, starting 3 years ago with gradually worsening course since that time. The patient noted no past surgery on the right knee(s).  Patient currently rates pain in the right knee(s) at 10 out of 10 with activity. Patient has night pain, worsening of pain with activity and weight bearing, pain that interferes with activities of daily living, pain with passive range of motion, crepitus and joint swelling.  Patient has evidence of subchondral sclerosis, periarticular osteophytes and joint space narrowing by imaging studies. There is no active infection.  Patient Active Problem List   Diagnosis Date Noted  . Primary osteoarthritis of right knee 10/01/2019  . Primary osteoarthritis of left knee 10/01/2019  . Unilateral primary osteoarthritis, left knee 12/17/2018  . Gastroesophageal reflux disease 12/07/2016  . Elevated LFTs 12/07/2016  . Acute pain of right knee 09/27/2016  . Chronic bilateral low back pain without sciatica 09/27/2016  . Degenerative arthritis of hip 04/26/2012   Past Medical History:  Diagnosis Date  . Arthritis   . Chronic kidney disease    kidney stone  . Depression   . GERD (gastroesophageal reflux disease)   . History of kidney stones   . Hypertension   . IBS (irritable bowel syndrome)   . Prostate cancer [facility])    prostate  . Right bundle branch block     Past Surgical History:  Procedure Laterality Date  .  APPENDECTOMY    . COLONOSCOPY    . HERNIA REPAIR    . PROSTATECTOMY  2016  . SHOULDER ARTHROSCOPY Right   . TOTAL HIP ARTHROPLASTY  04/26/2012   Procedure: TOTAL HIP ARTHROPLASTY ANTERIOR APPROACH;  Surgeon: Mcarthur Rossetti, MD;  Location: [facility];  Service: Orthopedics;  Laterality: Right;  Right Total Hip Arthroplasty    No current facility-administered medications for this encounter.   Current Outpatient Medications  Medication Sig Dispense Refill Last Dose  . aspirin 81 MG tablet Take 81 mg by mouth daily.     . Glucosamine-Chondroit-Vit C-Mn (GLUCOSAMINE-CHONDROITIN) TABS Take 1 tablet by mouth daily.     Marland Kitchen omeprazole (PRILOSEC) 20 MG capsule TAKE 1 CAPSULE BY MOUTH EVERY DAY (Patient taking differently: Take 20 mg by mouth daily. ) 90 capsule 3   . sildenafil (VIAGRA) 100 MG tablet Take 100 mg by mouth daily as needed for erectile dysfunction.     . tadalafil (CIALIS) 5 MG tablet Take 5 mg by mouth daily.      . valsartan-hydrochlorothiazide (DIOVAN-HCT) 160-12.5 MG tablet Take 1 tablet by mouth daily.     . minoxidil (ROGAINE) 2 % external solution Apply 1 application topically 2 (two) times daily. (Patient not taking: Reported on 10/28/2019)   Not Taking at Unknown time  . Multiple Vitamins-Iron (MULTIVITAMINS WITH IRON) TABS tablet Take by mouth. (Patient not taking: Reported on 10/28/2019)   Not Taking at Unknown time  . valsartan (DIOVAN) 160 MG tablet Take 160 mg by mouth daily. (Patient not taking: Reported on 10/28/2019)   Not Taking at Unknown time   Allergies  Allergen Reactions  .  Lisinopril Cough    Social History   Tobacco Use  . Smoking status: Never Smoker  . Smokeless tobacco: Never Used  Substance Use Topics  . Alcohol use: Yes    Alcohol/week: 1.0 standard drink    Types: 1 Shots of liquor per week    Comment: socially- occasional    Family History  Problem Relation Age of Onset  . Colon cancer Paternal Uncle        passed away age 45  . Heart  disease Mother   . Hypertension Father      Review of Systems  Musculoskeletal: Positive for joint swelling.  All other systems reviewed and are negative.   Objective:  Physical Exam Vitals reviewed.  Constitutional:      Appearance: Normal appearance.  HENT:     Head: Normocephalic and atraumatic.  Eyes:     Extraocular Movements: Extraocular movements intact.     Pupils: Pupils are equal, round, and reactive to light.  Cardiovascular:     Rate and Rhythm: Normal rate.     Pulses: Normal pulses.  Pulmonary:     Effort: Pulmonary effort is normal.  Abdominal:     Palpations: Abdomen is soft.  Musculoskeletal:     Cervical back: Normal range of motion.     Right knee: Effusion and bony tenderness present. Decreased range of motion. Tenderness present over the medial joint line. Abnormal alignment and abnormal meniscus.  Neurological:     Mental Status: He is alert and oriented to person, place, and time.  Psychiatric:        Behavior: Behavior normal.     Vital signs in last 24 hours:    Labs:   Estimated body mass index is 33.64 kg/m as calculated from the following:   Height as of 11/07/19: 6\' 1"  (1.854 m).   Weight as of 11/07/19: 115.7 kg.   Imaging Review Plain radiographs demonstrate severe degenerative joint disease of the right knee(s). The overall alignment ismild varus. The bone quality appears to be excellent for age and reported activity level.      Assessment/Plan:  End stage arthritis, right knee   The patient history, physical examination, clinical judgment of the provider and imaging studies are consistent with end stage degenerative joint disease of the right knee(s) and total knee arthroplasty is deemed medically necessary. The treatment options including medical management, injection therapy arthroscopy and arthroplasty were discussed at length. The risks and benefits of total knee arthroplasty were presented and reviewed. The risks due to  aseptic loosening, infection, stiffness, patella tracking problems, thromboembolic complications and other imponderables were discussed. The patient acknowledged the explanation, agreed to proceed with the plan and consent was signed. Patient is being admitted for inpatient treatment for surgery, pain control, PT, OT, prophylactic antibiotics, VTE prophylaxis, progressive ambulation and ADL's and discharge planning. The patient is planning to be discharged home with home health services

## 2019-11-14 ENCOUNTER — Ambulatory Visit (HOSPITAL_COMMUNITY): Payer: BC Managed Care – PPO | Admitting: Physician Assistant

## 2019-11-14 ENCOUNTER — Ambulatory Visit (HOSPITAL_COMMUNITY): Payer: BC Managed Care – PPO | Admitting: Certified Registered"

## 2019-11-14 ENCOUNTER — Other Ambulatory Visit: Payer: Self-pay

## 2019-11-14 ENCOUNTER — Encounter (HOSPITAL_COMMUNITY): Payer: Self-pay | Admitting: Orthopaedic Surgery

## 2019-11-14 ENCOUNTER — Observation Stay (HOSPITAL_COMMUNITY)
Admission: RE | Admit: 2019-11-14 | Discharge: 2019-11-15 | Disposition: A | Discharge status: Home / Self Care | Payer: BC Managed Care – PPO | Attending: Orthopaedic Surgery | Admitting: Orthopaedic Surgery

## 2019-11-14 ENCOUNTER — Encounter (HOSPITAL_COMMUNITY)
Admission: RE | Disposition: A | Discharge status: Home / Self Care | Payer: Self-pay | Source: Home / Self Care | Attending: Orthopaedic Surgery

## 2019-11-14 ENCOUNTER — Observation Stay (HOSPITAL_COMMUNITY): Payer: BC Managed Care – PPO

## 2019-11-14 DIAGNOSIS — Z7982 Long term (current) use of aspirin: Secondary | ICD-10-CM | POA: Diagnosis not present

## 2019-11-14 DIAGNOSIS — M1711 Unilateral primary osteoarthritis, right knee: Secondary | ICD-10-CM

## 2019-11-14 DIAGNOSIS — N189 Chronic kidney disease, unspecified: Secondary | ICD-10-CM | POA: Insufficient documentation

## 2019-11-14 DIAGNOSIS — I129 Hypertensive chronic kidney disease with stage 1 through stage 4 chronic kidney disease, or unspecified chronic kidney disease: Secondary | ICD-10-CM | POA: Insufficient documentation

## 2019-11-14 DIAGNOSIS — M25561 Pain in right knee: Secondary | ICD-10-CM | POA: Diagnosis present

## 2019-11-14 DIAGNOSIS — Z96651 Presence of right artificial knee joint: Secondary | ICD-10-CM

## 2019-11-14 DIAGNOSIS — Z8546 Personal history of malignant neoplasm of prostate: Secondary | ICD-10-CM | POA: Diagnosis not present

## 2019-11-14 DIAGNOSIS — Z79899 Other long term (current) drug therapy: Secondary | ICD-10-CM | POA: Diagnosis not present

## 2019-11-14 HISTORY — PX: TOTAL KNEE ARTHROPLASTY: SHX125

## 2019-11-14 LAB — TYPE AND SCREEN
ABO/RH(D): O NEG
Antibody Screen: NEGATIVE

## 2019-11-14 SURGERY — ARTHROPLASTY, KNEE, TOTAL
Anesthesia: Regional | Site: Knee | Laterality: Right

## 2019-11-14 MED ORDER — ACETAMINOPHEN 325 MG PO TABS: 325.0000 mg | ORAL_TABLET | Freq: Four times a day (QID) | ORAL | Status: DC | PRN

## 2019-11-14 MED ORDER — POLYETHYLENE GLYCOL 3350 17 G PO PACK: 17.0000 g | PACK | Freq: Every day | ORAL | Status: DC | PRN

## 2019-11-14 MED ORDER — SODIUM CHLORIDE 0.9 % IV SOLN: INTRAVENOUS | Status: DC

## 2019-11-14 MED ORDER — METHOCARBAMOL 500 MG PO TABS: 500.0000 mg | ORAL_TABLET | Freq: Four times a day (QID) | ORAL | Status: DC | PRN

## 2019-11-14 MED ORDER — DEXAMETHASONE SODIUM PHOSPHATE 10 MG/ML IJ SOLN
INTRAMUSCULAR | Status: DC | PRN
  Administered 2019-11-14: 10 mg
  Administered 2019-11-14: 5 mg

## 2019-11-14 MED ORDER — PROPOFOL 500 MG/50ML IV EMUL
INTRAVENOUS | Status: DC | PRN
  Administered 2019-11-14: 30 ug/kg/min via INTRAVENOUS

## 2019-11-14 MED ORDER — 0.9 % SODIUM CHLORIDE (POUR BTL) OPTIME
TOPICAL | Status: DC | PRN
  Administered 2019-11-14: 1000 mL

## 2019-11-14 MED ORDER — BUPIVACAINE-EPINEPHRINE 0.25% -1:200000 IJ SOLN
INTRAMUSCULAR | Status: DC | PRN
  Administered 2019-11-14: 30 mL

## 2019-11-14 MED ORDER — PHENYLEPHRINE 40 MCG/ML (10ML) SYRINGE FOR IV PUSH (FOR BLOOD PRESSURE SUPPORT)
PREFILLED_SYRINGE | INTRAVENOUS | Status: DC | PRN
  Administered 2019-11-14 (×2): 60 ug via INTRAVENOUS

## 2019-11-14 MED ORDER — SODIUM CHLORIDE 0.9 % IR SOLN
Status: DC | PRN
  Administered 2019-11-14: 1000 mL

## 2019-11-14 MED ORDER — OXYCODONE HCL 5 MG PO TABS
10.0000 mg | ORAL_TABLET | ORAL | Status: DC | PRN
  Administered 2019-11-14: 10 mg via ORAL

## 2019-11-14 MED ORDER — POVIDONE-IODINE 10 % EX SWAB
2.0000 "application " | Freq: Once | CUTANEOUS | Status: AC
  Administered 2019-11-14: 2 via TOPICAL

## 2019-11-14 MED ORDER — PROPOFOL 10 MG/ML IV BOLUS
INTRAVENOUS | Status: DC | PRN
  Administered 2019-11-14 (×2): 20 mg via INTRAVENOUS

## 2019-11-14 MED ORDER — ROPIVACAINE HCL 5 MG/ML IJ SOLN
INTRAMUSCULAR | Status: DC | PRN
  Administered 2019-11-14: 20 mL via PERINEURAL

## 2019-11-14 MED ORDER — PROPOFOL 1000 MG/100ML IV EMUL
INTRAVENOUS | Status: AC
  Filled 2019-11-14: qty 100

## 2019-11-14 MED ORDER — MIDAZOLAM HCL 2 MG/2ML IJ SOLN
INTRAMUSCULAR | Status: DC | PRN
  Administered 2019-11-14 (×2): 1 mg via INTRAVENOUS

## 2019-11-14 MED ORDER — MIDAZOLAM HCL 2 MG/2ML IJ SOLN
INTRAMUSCULAR | Status: AC
  Filled 2019-11-14: qty 2

## 2019-11-14 MED ORDER — BUPIVACAINE IN DEXTROSE 0.75-8.25 % IT SOLN
INTRATHECAL | Status: DC | PRN
  Administered 2019-11-14: 1.4 mL via INTRATHECAL

## 2019-11-14 MED ORDER — MENTHOL 3 MG MT LOZG: 1.0000 | LOZENGE | OROMUCOSAL | Status: DC | PRN

## 2019-11-14 MED ORDER — LACTATED RINGERS IV SOLN: INTRAVENOUS | Status: DC

## 2019-11-14 MED ORDER — PROPOFOL 10 MG/ML IV BOLUS
INTRAVENOUS | Status: AC
  Filled 2019-11-14: qty 20

## 2019-11-14 MED ORDER — HYDROCHLOROTHIAZIDE 12.5 MG PO CAPS
12.5000 mg | ORAL_CAPSULE | Freq: Every day | ORAL | Status: DC
  Administered 2019-11-14 – 2019-11-15 (×2): 12.5 mg via ORAL
  Filled 2019-11-14 (×2): qty 1

## 2019-11-14 MED ORDER — ACETAMINOPHEN 500 MG PO TABS: 1000.0000 mg | ORAL_TABLET | Freq: Once | ORAL | Status: AC

## 2019-11-14 MED ORDER — ACETAMINOPHEN 500 MG PO TABS
ORAL_TABLET | ORAL | Status: AC
  Administered 2019-11-14: 1000 mg via ORAL
  Filled 2019-11-14: qty 2

## 2019-11-14 MED ORDER — METHOCARBAMOL 500 MG IVPB - SIMPLE MED
INTRAVENOUS | Status: AC
  Administered 2019-11-14: 500 mg via INTRAVENOUS
  Filled 2019-11-14: qty 50

## 2019-11-14 MED ORDER — GABAPENTIN 100 MG PO CAPS
100.0000 mg | ORAL_CAPSULE | Freq: Three times a day (TID) | ORAL | Status: DC
  Administered 2019-11-14 – 2019-11-15 (×4): 100 mg via ORAL
  Filled 2019-11-14 (×4): qty 1

## 2019-11-14 MED ORDER — ONDANSETRON HCL 4 MG/2ML IJ SOLN: 4.0000 mg | Freq: Four times a day (QID) | INTRAMUSCULAR | Status: DC | PRN

## 2019-11-14 MED ORDER — DEXAMETHASONE SODIUM PHOSPHATE 10 MG/ML IJ SOLN
INTRAMUSCULAR | Status: AC
  Filled 2019-11-14: qty 1

## 2019-11-14 MED ORDER — VALSARTAN-HYDROCHLOROTHIAZIDE 160-12.5 MG PO TABS: 1.0000 | ORAL_TABLET | Freq: Every day | ORAL | Status: DC

## 2019-11-14 MED ORDER — ONDANSETRON HCL 4 MG PO TABS
4.0000 mg | ORAL_TABLET | Freq: Four times a day (QID) | ORAL | Status: DC | PRN
  Filled 2019-11-14: qty 1

## 2019-11-14 MED ORDER — FENTANYL CITRATE (PF) 100 MCG/2ML IJ SOLN
INTRAMUSCULAR | Status: AC
  Filled 2019-11-14: qty 2

## 2019-11-14 MED ORDER — FENTANYL CITRATE (PF) 100 MCG/2ML IJ SOLN
50.0000 ug | INTRAMUSCULAR | Status: DC
  Administered 2019-11-14: 100 ug via INTRAVENOUS
  Filled 2019-11-14: qty 2

## 2019-11-14 MED ORDER — LIDOCAINE 2% (20 MG/ML) 5 ML SYRINGE
INTRAMUSCULAR | Status: AC
  Filled 2019-11-14: qty 5

## 2019-11-14 MED ORDER — OXYCODONE HCL 5 MG PO TABS
5.0000 mg | ORAL_TABLET | ORAL | Status: DC | PRN
  Administered 2019-11-14 – 2019-11-15 (×3): 10 mg via ORAL
  Filled 2019-11-14 (×5): qty 2

## 2019-11-14 MED ORDER — METOCLOPRAMIDE HCL 5 MG/ML IJ SOLN: 5.0000 mg | Freq: Three times a day (TID) | INTRAMUSCULAR | Status: DC | PRN

## 2019-11-14 MED ORDER — ORAL CARE MOUTH RINSE: 15.0000 mL | Freq: Once | OROMUCOSAL | Status: AC

## 2019-11-14 MED ORDER — ONDANSETRON HCL 4 MG/2ML IJ SOLN
INTRAMUSCULAR | Status: AC
  Filled 2019-11-14: qty 2

## 2019-11-14 MED ORDER — KETOROLAC TROMETHAMINE 15 MG/ML IJ SOLN
7.5000 mg | Freq: Four times a day (QID) | INTRAMUSCULAR | Status: AC
  Administered 2019-11-14 – 2019-11-15 (×4): 7.5 mg via INTRAVENOUS
  Filled 2019-11-14 (×4): qty 1

## 2019-11-14 MED ORDER — PANTOPRAZOLE SODIUM 40 MG PO TBEC
40.0000 mg | DELAYED_RELEASE_TABLET | Freq: Every day | ORAL | Status: DC
  Administered 2019-11-15: 40 mg via ORAL
  Filled 2019-11-14 (×2): qty 1

## 2019-11-14 MED ORDER — MIDAZOLAM HCL 2 MG/2ML IJ SOLN
1.0000 mg | INTRAMUSCULAR | Status: DC
  Administered 2019-11-14: 2 mg via INTRAVENOUS
  Filled 2019-11-14: qty 2

## 2019-11-14 MED ORDER — PHENYLEPHRINE HCL (PRESSORS) 10 MG/ML IV SOLN
INTRAVENOUS | Status: AC
  Filled 2019-11-14: qty 1

## 2019-11-14 MED ORDER — CEFAZOLIN SODIUM-DEXTROSE 1-4 GM/50ML-% IV SOLN
1.0000 g | Freq: Four times a day (QID) | INTRAVENOUS | Status: AC
  Administered 2019-11-14 (×2): 1 g via INTRAVENOUS
  Filled 2019-11-14 (×2): qty 50

## 2019-11-14 MED ORDER — PHENOL 1.4 % MT LIQD: 1.0000 | OROMUCOSAL | Status: DC | PRN

## 2019-11-14 MED ORDER — IRBESARTAN 150 MG PO TABS
150.0000 mg | ORAL_TABLET | Freq: Every day | ORAL | Status: DC
  Administered 2019-11-14 – 2019-11-15 (×2): 150 mg via ORAL
  Filled 2019-11-14 (×2): qty 1

## 2019-11-14 MED ORDER — FENTANYL CITRATE (PF) 100 MCG/2ML IJ SOLN: 25.0000 ug | INTRAMUSCULAR | Status: DC | PRN

## 2019-11-14 MED ORDER — ASPIRIN EC 325 MG PO TBEC
325.0000 mg | DELAYED_RELEASE_TABLET | Freq: Two times a day (BID) | ORAL | Status: DC
  Administered 2019-11-14 – 2019-11-15 (×3): 325 mg via ORAL
  Filled 2019-11-14 (×3): qty 1

## 2019-11-14 MED ORDER — BUPIVACAINE-EPINEPHRINE (PF) 0.25% -1:200000 IJ SOLN
INTRAMUSCULAR | Status: AC
  Filled 2019-11-14: qty 30

## 2019-11-14 MED ORDER — DOCUSATE SODIUM 100 MG PO CAPS
100.0000 mg | ORAL_CAPSULE | Freq: Two times a day (BID) | ORAL | Status: DC
  Administered 2019-11-14 – 2019-11-15 (×3): 100 mg via ORAL
  Filled 2019-11-14 (×3): qty 1

## 2019-11-14 MED ORDER — METOCLOPRAMIDE HCL 5 MG PO TABS: 5.0000 mg | ORAL_TABLET | Freq: Three times a day (TID) | ORAL | Status: DC | PRN

## 2019-11-14 MED ORDER — HYDROMORPHONE HCL 1 MG/ML IJ SOLN: 0.5000 mg | INTRAMUSCULAR | Status: DC | PRN

## 2019-11-14 MED ORDER — GLYCOPYRROLATE PF 0.2 MG/ML IJ SOSY
PREFILLED_SYRINGE | INTRAMUSCULAR | Status: AC
  Filled 2019-11-14: qty 1

## 2019-11-14 MED ORDER — ALUM & MAG HYDROXIDE-SIMETH 200-200-20 MG/5ML PO SUSP: 30.0000 mL | ORAL | Status: DC | PRN

## 2019-11-14 MED ORDER — FENTANYL CITRATE (PF) 100 MCG/2ML IJ SOLN
INTRAMUSCULAR | Status: AC
  Administered 2019-11-14: 50 ug via INTRAVENOUS
  Filled 2019-11-14: qty 2

## 2019-11-14 MED ORDER — METHOCARBAMOL 500 MG IVPB - SIMPLE MED
500.0000 mg | Freq: Four times a day (QID) | INTRAVENOUS | Status: DC | PRN
  Filled 2019-11-14: qty 50

## 2019-11-14 MED ORDER — TRANEXAMIC ACID-NACL 1000-0.7 MG/100ML-% IV SOLN
1000.0000 mg | INTRAVENOUS | Status: AC
  Administered 2019-11-14: 1000 mg via INTRAVENOUS
  Filled 2019-11-14: qty 100

## 2019-11-14 MED ORDER — CHLORHEXIDINE GLUCONATE 0.12 % MT SOLN
15.0000 mL | Freq: Once | OROMUCOSAL | Status: AC
  Administered 2019-11-14: 15 mL via OROMUCOSAL

## 2019-11-14 MED ORDER — STERILE WATER FOR IRRIGATION IR SOLN
Status: DC | PRN
  Administered 2019-11-14: 1000 mL

## 2019-11-14 MED ORDER — CEFAZOLIN SODIUM-DEXTROSE 2-4 GM/100ML-% IV SOLN
2.0000 g | INTRAVENOUS | Status: AC
  Administered 2019-11-14: 2 g via INTRAVENOUS
  Filled 2019-11-14: qty 100

## 2019-11-14 MED ORDER — DIPHENHYDRAMINE HCL 12.5 MG/5ML PO ELIX: 12.5000 mg | ORAL_SOLUTION | ORAL | Status: DC | PRN

## 2019-11-14 SURGICAL SUPPLY — 56 items
BAG ZIPLOCK 12X15 (MISCELLANEOUS) IMPLANT
BENZOIN TINCTURE PRP APPL 2/3 (GAUZE/BANDAGES/DRESSINGS) IMPLANT
BLADE SAG 18X100X1.27 (BLADE) IMPLANT
BLADE SURG SZ10 CARB STEEL (BLADE) ×4 IMPLANT
BNDG ELASTIC 6X10 VLCR STRL LF (GAUZE/BANDAGES/DRESSINGS) ×4 IMPLANT
BNDG ELASTIC 6X5.8 VLCR STR LF (GAUZE/BANDAGES/DRESSINGS) ×2 IMPLANT
BOWL SMART MIX CTS (DISPOSABLE) IMPLANT
COVER SURGICAL LIGHT HANDLE (MISCELLANEOUS) ×2 IMPLANT
COVER WAND RF STERILE (DRAPES) IMPLANT
CUFF TOURN SGL QUICK 34 (TOURNIQUET CUFF) ×1
CUFF TRNQT CYL 34X4.125X (TOURNIQUET CUFF) ×1 IMPLANT
DECANTER SPIKE VIAL GLASS SM (MISCELLANEOUS) IMPLANT
DRAPE U-SHAPE 47X51 STRL (DRAPES) ×2 IMPLANT
DRSG PAD ABDOMINAL 8X10 ST (GAUZE/BANDAGES/DRESSINGS) ×4 IMPLANT
DURAPREP 26ML APPLICATOR (WOUND CARE) ×2 IMPLANT
ELECT BLADE TIP CTD 4 INCH (ELECTRODE) ×2 IMPLANT
ELECT REM PT RETURN 15FT ADLT (MISCELLANEOUS) ×2 IMPLANT
FEMORAL POSTERIOR SZ4 RT (Femur) ×1 IMPLANT
GAUZE SPONGE 4X4 12PLY STRL (GAUZE/BANDAGES/DRESSINGS) ×2 IMPLANT
GAUZE XEROFORM 1X8 LF (GAUZE/BANDAGES/DRESSINGS) ×2 IMPLANT
GLOVE BIO SURGEON STRL SZ7.5 (GLOVE) ×2 IMPLANT
GLOVE BIOGEL PI IND STRL 8 (GLOVE) ×2 IMPLANT
GLOVE BIOGEL PI INDICATOR 8 (GLOVE) ×2
GLOVE ECLIPSE 8.0 STRL XLNG CF (GLOVE) ×2 IMPLANT
GOWN STRL REUS W/TWL XL LVL3 (GOWN DISPOSABLE) ×4 IMPLANT
HANDPIECE INTERPULSE COAX TIP (DISPOSABLE) ×1
HOLDER FOLEY CATH W/STRAP (MISCELLANEOUS) IMPLANT
IMMOBILIZER KNEE 20 (SOFTGOODS) ×2
IMMOBILIZER KNEE 20 THIGH 36 (SOFTGOODS) ×1 IMPLANT
INSERT TIB PS SZ5 11 KNEE (Miscellaneous) ×2 IMPLANT
KIT TURNOVER KIT A (KITS) IMPLANT
KNEE PATELLA ASYMMETRIC 10X32 (Knees) ×2 IMPLANT
KNEE TIBIAL COMPONENT SZ5 (Knees) ×2 IMPLANT
NS IRRIG 1000ML POUR BTL (IV SOLUTION) ×2 IMPLANT
PACK TOTAL KNEE CUSTOM (KITS) ×2 IMPLANT
PADDING CAST COTTON 6X4 STRL (CAST SUPPLIES) ×4 IMPLANT
PADDING CAST SYN 6 (CAST SUPPLIES) ×2
PADDING CAST SYNTHETIC 6X4 NS (CAST SUPPLIES) ×2 IMPLANT
PENCIL SMOKE EVACUATOR (MISCELLANEOUS) IMPLANT
PIN FLUTED HEDLESS FIX 3.5X1/8 (PIN) ×2 IMPLANT
POSTERIOR FEMORAL SZ4 RT (Femur) ×2 IMPLANT
PROTECTOR NERVE ULNAR (MISCELLANEOUS) ×2 IMPLANT
SET HNDPC FAN SPRY TIP SCT (DISPOSABLE) ×1 IMPLANT
SET PAD KNEE POSITIONER (MISCELLANEOUS) ×2 IMPLANT
STAPLER VISISTAT 35W (STAPLE) IMPLANT
STRIP CLOSURE SKIN 1/2X4 (GAUZE/BANDAGES/DRESSINGS) IMPLANT
SUT MNCRL AB 4-0 PS2 18 (SUTURE) IMPLANT
SUT VIC AB 0 CT1 27 (SUTURE) ×1
SUT VIC AB 0 CT1 27XBRD ANTBC (SUTURE) ×1 IMPLANT
SUT VIC AB 1 CT1 36 (SUTURE) ×4 IMPLANT
SUT VIC AB 2-0 CT1 27 (SUTURE) ×2
SUT VIC AB 2-0 CT1 TAPERPNT 27 (SUTURE) ×2 IMPLANT
TRAY FOLEY MTR SLVR 16FR STAT (SET/KITS/TRAYS/PACK) ×2 IMPLANT
WATER STERILE IRR 1000ML POUR (IV SOLUTION) ×2 IMPLANT
WRAP KNEE MAXI GEL POST OP (GAUZE/BANDAGES/DRESSINGS) ×2 IMPLANT
YANKAUER SUCT BULB TIP 10FT TU (MISCELLANEOUS) ×2 IMPLANT

## 2019-11-14 NOTE — TOC Transition Note (Signed)
Transition of Care [facility]) - CM/SW Discharge Note   Patient Details  Name: Brandon Franco MRN: [id number] Date of Birth: Jan 01, 1961  [facility] CM/SW Contact:  Lennart Pall, LCSW Phone Number: 11/14/2019, 2:45 PM   Clinical Narrative:     Met briefly with pt and confirming he has needed DME at home.  Plan for HHPT via [facility] - referral placed prior to surgery.  No further TOC needs.  Final next level of care: [place] Barriers to Discharge: No Barriers Identified   Patient Goals and CMS Choice Patient states their goals for this hospitalization and ongoing recovery are:: go home      Discharge Placement                       Discharge Plan and Services                DME Arranged: N/A DME Agency: NA       HH Arranged: PT [place] Agency: [facility] (formerly [employer])        Social Determinants of Health (SDOH) Interventions     Readmission Risk Interventions No flowsheet data found.

## 2019-11-14 NOTE — Progress Notes (Signed)
Orthopedic Tech Progress Note Patient Details:  Brandon Franco 1960/08/02 [id number]  CPM Right Knee CPM Right Knee: Off Right Knee Flexion (Degrees): 90 Right Knee Extension (Degrees): 0 Additional Comments: Trapeze bar  Post Interventions Patient Tolerated: Well Instructions Provided: Care of device  [name] 11/14/2019, 4:08 PM

## 2019-11-14 NOTE — Evaluation (Signed)
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  Pt should progress to dc home with family assist.    Follow Up Recommendations Home health PT;Follow surgeon's recommendation for DC plan and follow-up therapies    Equipment Recommendations  None recommended by PT    Recommendations for Other Services       Precautions / Restrictions Precautions Precautions: Fall;Knee Restrictions Weight Bearing Restrictions: No RLE Weight Bearing: Weight bearing as tolerated      Mobility  Bed Mobility Overal bed mobility: Needs Assistance Bed Mobility: Supine to Sit     Supine to sit: Min guard     General bed mobility comments: cues for sequence and min guard for R LE  Transfers Overall transfer level: Needs assistance   Transfers: Sit to/from Stand Sit to Stand: Min assist         General transfer comment: cues for LE management and use of UEs to self assist  Ambulation/Gait Ambulation/Gait assistance: Min assist;Min guard Gait Distance (Feet): 180 Feet Assistive device: Rolling walker (2 wheeled) Gait Pattern/deviations: Step-to pattern;Decreased step length - right;Decreased step length - left;Shuffle;Trunk flexed     General Gait Details: cues for posture, position from RW and initial sequence  Stairs            Wheelchair Mobility    Modified Rankin (Stroke Patients Only)       Balance Overall balance assessment: Mild deficits observed, not formally tested                                           Pertinent Vitals/Pain Pain Assessment: 0-10 Pain Score: 3  Pain Location: R knee Pain Descriptors / Indicators: Aching;Sore Pain Intervention(s): Limited  activity within patient's tolerance;Monitored during session;Premedicated before session;Ice applied    Home Living Family/patient expects to be discharged to:: Private residence Living Arrangements: Spouse/significant other Available Help at Discharge: Family Type of Home: House Home Access: Stairs to enter Entrance Stairs-Rails: Left Entrance Stairs-Number of Steps: 5 Home Layout: One level Home Equipment: Environmental consultant - 2 wheels;Cane - single point;Bedside commode      Prior Function Level of Independence: Independent               Hand Dominance        Extremity/Trunk Assessment   Upper Extremity Assessment Upper Extremity Assessment: Overall WFL for tasks assessed    Lower Extremity Assessment Lower Extremity Assessment: RLE deficits/detail    Cervical / Trunk Assessment Cervical / Trunk Assessment: Normal  Communication   Communication: No difficulties  Cognition Arousal/Alertness: Awake/alert Behavior During Therapy: WFL for tasks assessed/performed Overall Cognitive Status: Within Functional Limits for tasks assessed                                        General Comments      Exercises Total Joint Exercises Ankle Circles/Pumps: AROM;Both;15 reps;Supine   Assessment/Plan    PT Assessment Patient needs continued PT services  PT Problem List Decreased strength;Decreased range of motion;Decreased activity  tolerance;Decreased balance;Decreased mobility;Decreased knowledge of use of DME;Pain       PT Treatment Interventions DME instruction;Gait training;Stair training;Functional mobility training;Therapeutic activities;Therapeutic exercise;Patient/family education    PT Goals (Current goals can be found in the Care Plan section)  Acute Rehab PT Goals Patient Stated Goal: Regain IND PT Goal Formulation: With patient Time For Goal Achievement: 11/21/19 Potential to Achieve Goals: Good    Frequency 7X/week   Barriers to discharge         Co-evaluation               AM-PAC PT "6 Clicks" Mobility  Outcome Measure Help needed turning from your back to your side while in a flat bed without using bedrails?: A Little Help needed moving from lying on your back to sitting on the side of a flat bed without using bedrails?: A Little Help needed moving to and from a bed to a chair (including a wheelchair)?: A Little Help needed standing up from a chair using your arms (e.g., wheelchair or bedside chair)?: A Little Help needed to walk in hospital room?: A Little Help needed climbing 3-5 steps with a railing? : A Little 6 Click Score: 18    End of Session Equipment Utilized During Treatment: Gait belt Activity Tolerance: Patient tolerated treatment well Patient left: in chair;with call bell/phone within reach;with chair alarm set Nurse Communication: Mobility status PT Visit Diagnosis: Difficulty in walking, not elsewhere classified (R26.2)    Time: 1412-1440 PT Time Calculation (min) (ACUTE ONLY): 28 min   Charges:   PT Evaluation $PT Eval Low Complexity: 1 Low PT Treatments $Gait Training: 8-22 mins        [place] Pager [phone] Office [phone]   Caroljean Monsivais 11/14/2019, 2:49 PM

## 2019-11-14 NOTE — Op Note (Signed)
NAMEKACHE, MCCLURG MEDICAL RECORD [id number] ACCOUNT [id number] DATE OF BIRTH:December 04, 1960 FACILITY: [facility] LOCATION: [facility] PHYSICIAN:Ismael Treptow Kerry Fort, MD  OPERATIVE REPORT  DATE OF PROCEDURE:  11/14/2019  PREOPERATIVE DIAGNOSIS:  Primary osteoarthritis and degenerative joint disease, right knee.  POSTOPERATIVE DIAGNOSIS:  Primary osteoarthritis and degenerative joint disease, right knee.  PROCEDURE:  Right total knee arthroplasty.  IMPLANTS:  Stryker Triathlon press-fit knee system with size 4 femur, size 5 tibial tray, 11 mm fixed bearing polyethylene insert, size 32 patellar button.  SURGEON:  Lind Guest. Ninfa Linden, MD  ASSISTANT:  Erskine Emery, PA-C.  ANESTHESIA: 1.  Right lower extremity adductor canal block. 2.  Spinal. 3.  Local with mixture of Marcaine with epinephrine around the arthrotomy.  ANTIBIOTICS:  Two g IV Ancef.  TOURNIQUET TIME:  Less than 1 hour.  ESTIMATED BLOOD LOSS:  100 mL.  COMPLICATIONS:  None.  INDICATIONS:  The patient is a 59 year old very active individual, well known to me.  We actually had replaced the hip before on him.  His right knee and left knee have been hurting quite a bit and having recurrent effusions.  He sustained an acute  injury to the right knee earlier this year and we took him to the operating room and performed a partial medial meniscectomy.  We found areas of full-thickness cartilage loss along the medial and lateral femoral condyles.  He had not had issues with that  knee until this twisting injury and the meniscal tear.  Since the arthroscopy, he still had recurrent effusions and significant pain in the knee.  At this point, he does wish to proceed with a total knee arthroplasty and we have recommended this to him  as well given the failure of conservative treatment.  We talked about the risk of acute blood loss anemia, nerve or vessel injury, fracture, infection, DVT, and implant failure.  We talked about the  goals being decreased pain, improved mobility and  overall improved quality of life.  DESCRIPTION OF PROCEDURE:  After informed consent was obtained, the appropriate right knee was marked.  An adductor canal block was obtained in the holding room of the right lower extremity.  He was then brought to the operating room and sat up on the  operating table where spinal anesthesia was obtained.  A Foley catheter was placed and nonsterile tourniquet was placed on his upper right thigh and his right thigh, knee, leg, and ankle were prepped and draped with DuraPrep and sterile drapes including  a sterile stockinette.  A time-out was called to identified as correct patient, correct right knee.  We then used an Esmarch to wrap that leg and tourniquet was inflated to 300 mm of pressure.  We then made a direct midline incision over the patella and  carried this proximally and distally.  I dissected down to the knee joint and carried out a medial parapatellar arthrotomy, finding a very large joint effusion and significant arthritis in the knee.  With the knee in a flexed position, we removed  periarticular osteophytes, as well as remnants of ACL, PCL, medial and lateral meniscus.  Using extramedullary cutting guide for the tibia, we made our proximal tibia cut, taking 5 mm off the high side, correcting for varus and valgus and a neutral  slope.  We made this cut without difficulty.  We then used an intramedullary drill for making our entry for our intramedullary cutting guide for distal femoral cut.  We set this for right knee at 5 degrees externally rotated  for an 8 mm distal femoral  cut.  We made that cut without difficulty and brought the knee back down to full extension with a 9 mm extension block and achieved full extension.  We then went back to the femur and made our femoral sizing guide based off the epicondylar axis.  Based  off this, we chose a size 4 femur.  We put a 4-in-1 cutting block for a size 4 femur,  made our anterior and posterior cuts, followed by our chamfer cuts.  We then made our femoral box cut.  Attention was then turned back to for the tibia.  For the tibia,  we chose a size 5 tibial tray for coverage and set the rotation off the tibial tubercle and the femur, making our keel cut off of this.  With a size 5 tibial tray and the size 4 right femur, we tried a 9 and 11 millimeter fixed bearing polyethylene  insert and we were pleased with stability and range of motion of the 11 insert.  Of note, the bone quality was really good, so we felt that a press-fit system was warranted.  We then made our patellar cut and drilled 3 holes for a size 32 press-fit  patellar button.  We removed all trial components from the knee, again after put him through a range of motion and we did our final tibial preparation.  We then irrigated the knee with normal saline solution using pulsatile lavage.  We dried the knee  real well and then placed our Marcaine and epinephrine mixture of local around the arthrotomy.  We then dried the knee real well and with the knee in a flexed position, placed our Stryker Triathlon press-fit tibial tray size 5, followed by our size 4  press-fit femur.  We placed our real 11 mm fixed bearing polyethylene insert and press-fit our patellar button.  I then put the knee through several cycles of range of motion.  We were pleased with stability and range of motion.  We then let the  tourniquet down.  Hemostasis obtained with electrocautery.  I then closed the arthrotomy with interrupted #1 Vicryl suture, followed by closing the deep tissue was 0 Vicryl, 2-0 Vicryl was used to close the subcutaneous tissue and staples were used to  close the skin.  He was then taken off the operative table after sterile dressing was applied and taken to recovery room in stable condition.  All final counts being correct and no complications noted.  Of note Benita Stabile, PA-C assisted during the entire  case and his  assistance was crucial for facilitating all aspects of this case.  VN/NUANCE  D:11/14/2019 T:11/14/2019 JOB:[id number]

## 2019-11-14 NOTE — Transfer of Care (Signed)
Immediate Anesthesia Transfer of Care Note  Patient: Brandon Franco  Procedure(s) Performed: RIGHT TOTAL KNEE ARTHROPLASTY (Right Knee)  Patient Location: PACU  Anesthesia Type:Spinal and MAC combined with regional for post-op pain  Level of Consciousness: awake, oriented, patient cooperative and responds to stimulation  Airway & Oxygen Therapy: Patient Spontanous Breathing and Patient connected to face mask oxygen  Post-op Assessment: Report given to RN and Post -op Vital signs reviewed and stable  Post vital signs: Reviewed and stable  Last Vitals:  Vitals Value Taken Time  BP 112/62 11/14/19 1017  Temp    Pulse 77 11/14/19 1018  Resp 19 11/14/19 1018  SpO2 93 % 11/14/19 1018  Vitals shown include unvalidated device data.  Last Pain:  Vitals:   11/14/19 0615  TempSrc:   PainSc: 0-No pain      Patients Stated Pain Goal: 4 (72/09/19 8022)  Complications: No complications documented.

## 2019-11-14 NOTE — Anesthesia Procedure Notes (Signed)
Spinal  Patient location during procedure: OR Start time: 11/14/2019 8:36 AM Staffing Performed: resident/CRNA  Anesthesiologist: Brandon March, MD Resident/CRNA: Eben Burow, CRNA Preanesthetic Checklist Completed: patient identified, IV checked, site marked, risks and benefits discussed, surgical consent, monitors and equipment checked, pre-op evaluation and timeout performed Spinal Block Patient position: sitting Prep: DuraPrep and site prepped and draped Patient monitoring: heart rate, cardiac monitor, continuous pulse ox and blood pressure Approach: midline Location: L3-4 Injection technique: single-shot Needle Needle type: Pencan  Needle gauge: 24 G Needle length: 9 cm Assessment Sensory level: T4 Additional Notes Pt placed in sitting position, spinal kit expiration date checked and verified, + CSF, - heme, pt tolerated well. Dr Lanetta Inch present and supervising throughout Brandon Franco.

## 2019-11-14 NOTE — Anesthesia Postprocedure Evaluation (Signed)
Anesthesia Post Note  Patient: Brandon Franco  Procedure(s) Performed: RIGHT TOTAL KNEE ARTHROPLASTY (Right Knee)     Patient location during evaluation: PACU Anesthesia Type: Regional and Spinal Level of consciousness: oriented and awake and alert Pain management: pain level controlled Vital Signs Assessment: post-procedure vital signs reviewed and stable Respiratory status: spontaneous breathing, respiratory function stable and patient connected to nasal cannula oxygen Cardiovascular status: blood pressure returned to baseline and stable Postop Assessment: no headache, no backache and no apparent nausea or vomiting Anesthetic complications: no   No complications documented.  Last Vitals:  Vitals:   11/14/19 1137 11/14/19 1200  BP:  123/72  Pulse: (!) 53 (!) 49  Resp: 13 17  Temp: 36.7 C 36.6 C  SpO2: 99% 100%    Last Pain:  Vitals:   11/14/19 1200  TempSrc: Oral  PainSc:                  Kellianne Ek L [place]

## 2019-11-14 NOTE — Progress Notes (Signed)
AssistedDr. Chelsey Woodrum with right, ultrasound guided, adductor canal block. Side rails up, monitors on throughout procedure. See vital signs in flow sheet. Tolerated Procedure well.  

## 2019-11-14 NOTE — Interval H&P Note (Signed)
History and Physical Interval Note: The patient understands he is here today for a right total knee arthroplasty to treat the pain from his osteoarthritis of the right knee.  There is been no acute change in medical status.  See recent H&P.  The risk and benefits of surgery been explained in detail and informed consent is obtained.  11/14/2019 7:04 AM  Okey Regal  has presented today for surgery, with the diagnosis of Osteoarthritis Right Knee.  The various methods of treatment have been discussed with the patient and family. After consideration of risks, benefits and other options for treatment, the patient has consented to  Procedure(s): RIGHT TOTAL KNEE ARTHROPLASTY (Right) as a surgical intervention.  The patient's history has been reviewed, patient examined, no change in status, stable for surgery.  I have reviewed the patient's chart and labs.  Questions were answered to the patient's satisfaction.     Mcarthur Rossetti

## 2019-11-14 NOTE — Progress Notes (Signed)
Orthopedic Tech Progress Note Patient Details:  Brandon Franco 05/13/1960 [id number]  CPM Right Knee CPM Right Knee: On Right Knee Flexion (Degrees): 90 Right Knee Extension (Degrees): 0 Additional Comments: Trapeze bar  Post Interventions Patient Tolerated: Well Instructions Provided: Care of device  [name] 11/14/2019, 11:18 AM

## 2019-11-14 NOTE — Brief Op Note (Signed)
11/14/2019  9:57 AM  PATIENT:  Okey Regal  59 y.o. male  PRE-OPERATIVE DIAGNOSIS:  Osteoarthritis Right Knee  POST-OPERATIVE DIAGNOSIS:  Osteoarthritis Right Knee  PROCEDURE:  Procedure(s): RIGHT TOTAL KNEE ARTHROPLASTY (Right)  SURGEON:  Surgeon(s) and Role:    Mcarthur Rossetti, MD - Primary  PHYSICIAN ASSISTANT:  Benita Stabile, PA-C  ANESTHESIA:   local, regional and spinal  EBL:  50 mL   COUNTS:  YES  TOURNIQUET:   Total Tourniquet Time Documented: Thigh (Right) - 49 minutes Total: Thigh (Right) - 49 minutes   DICTATION: .Other Dictation: Dictation Number [id number]  PLAN OF CARE: Admit for overnight observation  PATIENT DISPOSITION:  PACU - hemodynamically stable.   Delay start of Pharmacological VTE agent (>24hrs) due to surgical blood loss or risk of bleeding: no

## 2019-11-14 NOTE — Anesthesia Procedure Notes (Signed)
Procedure Name: MAC Date/Time: 11/14/2019 8:33 AM Performed by: Eben Burow, CRNA Pre-anesthesia Checklist: Patient identified, Emergency Drugs available, Suction available, Patient being monitored and Timeout performed Oxygen Delivery Method: Simple face mask Placement Confirmation: positive ETCO2

## 2019-11-14 NOTE — Anesthesia Procedure Notes (Signed)
Anesthesia Regional Block: Adductor canal block   Pre-Anesthetic Checklist: ,, timeout performed, Correct Patient, Correct Site, Correct Laterality, Correct Procedure, Correct Position, site marked, Risks and benefits discussed,  Surgical consent,  Pre-op evaluation,  At surgeon's request and post-op pain management  Laterality: Right  Prep: Maximum Sterile Barrier Precautions used, chloraprep       Needles:  Injection technique: Single-shot  Needle Type: Echogenic Stimulator Needle     Needle Length: 9cm  Needle Gauge: 22     Additional Needles:   Procedures:,,,, ultrasound used (permanent image in chart),,,,  Narrative:  Start time: 11/14/2019 7:03 AM End time: 11/14/2019 7:13 AM Injection made incrementally with aspirations every 5 mL.  Performed by: Personally  Anesthesiologist: Freddrick March, MD  Additional Notes: Monitors applied. No increased pain on injection. No increased resistance to injection. Injection made in 5cc increments. Good needle visualization. Patient tolerated procedure well.

## 2019-11-15 DIAGNOSIS — M1711 Unilateral primary osteoarthritis, right knee: Secondary | ICD-10-CM | POA: Diagnosis not present

## 2019-11-15 LAB — CBC
HCT: 38.5 % — ABNORMAL LOW (ref 39.0–52.0)
Hemoglobin: 13.5 g/dL (ref 13.0–17.0)
MCH: 32.2 pg (ref 26.0–34.0)
MCHC: 35.1 g/dL (ref 30.0–36.0)
MCV: 91.9 fL (ref 80.0–100.0)
Platelets: 215 10*3/uL (ref 150–400)
RBC: 4.19 MIL/uL — ABNORMAL LOW (ref 4.22–5.81)
RDW: 12.5 % (ref 11.5–15.5)
WBC: 12.6 10*3/uL — ABNORMAL HIGH (ref 4.0–10.5)
nRBC: 0 % (ref 0.0–0.2)

## 2019-11-15 LAB — BASIC METABOLIC PANEL
Anion gap: 10 (ref 5–15)
BUN: 13 mg/dL (ref 6–20)
CO2: 23 mmol/L (ref 22–32)
Calcium: 8.8 mg/dL — ABNORMAL LOW (ref 8.9–10.3)
Chloride: 103 mmol/L (ref 98–111)
Creatinine, Ser: 0.86 mg/dL (ref 0.61–1.24)
GFR calc Af Amer: >60 mL/min (ref >60)
GFR calc non Af Amer: >60 mL/min (ref >60)
Glucose, Bld: 120 mg/dL — ABNORMAL HIGH (ref 70–99)
Potassium: 3.5 mmol/L (ref 3.5–5.1)
Sodium: 136 mmol/L (ref 135–145)

## 2019-11-15 MED ORDER — OXYCODONE HCL 5 MG PO TABS: 5.0000 mg | ORAL_TABLET | ORAL | 0 refills | Status: DC | PRN

## 2019-11-15 MED ORDER — METHOCARBAMOL 500 MG PO TABS: 500.0000 mg | ORAL_TABLET | Freq: Four times a day (QID) | ORAL | 1 refills | Status: DC | PRN

## 2019-11-15 MED ORDER — ASPIRIN 325 MG PO TBEC: 325.0000 mg | DELAYED_RELEASE_TABLET | Freq: Two times a day (BID) | ORAL | 0 refills | Status: DC

## 2019-11-15 NOTE — Progress Notes (Signed)
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  Pt up to ambulate in hall, negotiated stairs and completed HEP - written instruction provided and reviewed.   Follow Up Recommendations  Home health PT;Follow surgeon's recommendation for DC plan and follow-up therapies     Equipment Recommendations  None recommended by PT    Recommendations for Other Services       Precautions / Restrictions Precautions Precautions: Fall;Knee Restrictions Weight Bearing Restrictions: No RLE Weight Bearing: Weight bearing as tolerated    Mobility  Bed Mobility Overal bed mobility: Needs Assistance Bed Mobility: Supine to Sit     Supine to sit: Supervision     General bed mobility comments: Pt up in chair and requests back to same  Transfers Overall transfer level: Needs assistance Equipment used: Rolling walker (2 wheeled) Transfers: Sit to/from Stand Sit to Stand: Supervision         General transfer comment: cues for LE management and use of UEs to self assist  Ambulation/Gait Ambulation/Gait assistance: Min guard;Supervision Gait Distance (Feet): 200 Feet Assistive device: Rolling walker (2 wheeled) Gait Pattern/deviations: Decreased step length - right;Decreased step length - left;Shuffle;Trunk flexed;Step-to pattern;Step-through pattern     General Gait Details: cues for posture, position from RW and initial sequence   Stairs             Wheelchair Mobility    Modified Rankin (Stroke Patients Only)       Balance Overall balance assessment: Mild deficits observed, not formally tested                                          Cognition Arousal/Alertness: Awake/alert Behavior During  Therapy: WFL for tasks assessed/performed Overall Cognitive Status: Within Functional Limits for tasks assessed                                        Exercises Total Joint Exercises Ankle Circles/Pumps: AROM;Both;15 reps;Supine Quad Sets: AROM;Both;10 reps;Supine Heel Slides: AAROM;Right;15 reps;Supine Straight Leg Raises: AAROM;AROM;Right;10 reps;Supine Long Arc Quad: AAROM;AROM;Right;10 reps;Seated Knee Flexion: AAROM;Right;10 reps;Seated Goniometric ROM: -6 - 85    General Comments        Pertinent Vitals/Pain Pain Assessment: 0-10 Pain Score: 5  Pain Location: R knee Pain Descriptors / Indicators: Aching;Sore Pain Intervention(s): Limited activity within patient's tolerance;Monitored during session;Premedicated before session;Ice applied    Home Living                      Prior Function            PT Goals (current goals can now be found in the care plan section) Acute Rehab PT Goals Patient Stated Goal: Regain IND PT Goal Formulation: With patient Time For Goal Achievement: 11/21/19 Potential to Achieve Goals: Good Progress towards PT goals: Progressing toward goals    Frequency    7X/week      PT Plan Current plan remains appropriate    Co-evaluation  AM-PAC PT "6 Clicks" Mobility   Outcome Measure  Help needed turning from your back to your side while in a flat bed without using bedrails?: A Little Help needed moving from lying on your back to sitting on the side of a flat bed without using bedrails?: A Little Help needed moving to and from a bed to a chair (including a wheelchair)?: A Little Help needed standing up from a chair using your arms (e.g., wheelchair or bedside chair)?: A Little Help needed to walk in hospital room?: A Little Help needed climbing 3-5 steps with a railing? : A Little 6 Click Score: 18    End of Session Equipment Utilized During Treatment: Gait belt Activity Tolerance:  Patient tolerated treatment well Patient left: in chair;with call bell/phone within reach;with chair alarm set Nurse Communication: Mobility status PT Visit Diagnosis: Difficulty in walking, not elsewhere classified (R26.2)     Time: 9166-0600 PT Time Calculation (min) (ACUTE ONLY): 37 min  Charges:  $Gait Training: 8-22 mins $Therapeutic Exercise: 8-22 mins                     Debe Coder PT Acute Rehabilitation Services Pager [phone] Office [phone]    Reiss Mowrey 11/15/2019, 12:52 PM

## 2019-11-15 NOTE — Progress Notes (Signed)
Foley cath removed at approximately [phone] without difficulty.

## 2019-11-15 NOTE — Progress Notes (Signed)
Orthopedic Tech Progress Note Patient Details:  Brandon Franco 11-28-1960 [id number]  CPM Right Knee CPM Right Knee: On Right Knee Flexion (Degrees): 90 Right Knee Extension (Degrees): 0 Additional Comments: Trapeze bar  Post Interventions Patient Tolerated: Well Instructions Provided: Care of device  [name] 11/15/2019, 12:19 PM

## 2019-11-15 NOTE — Progress Notes (Signed)
Subjective: 1 Day Post-Op Procedure(s) (LRB): RIGHT TOTAL KNEE ARTHROPLASTY (Right) Patient reports pain as moderate.    Objective: Vital signs in last 24 hours: Temp:  [97.6 F (36.4 C)-98.3 F (36.8 C)] 98.1 F (36.7 C) (08/07 0954) Pulse Rate:  [47-87] 73 (08/07 0954) Resp:  [13-18] 17 (08/07 0954) BP: (113-139)/(66-89) 118/70 (08/07 0954) SpO2:  [97 %-100 %] 98 % (08/07 0954)  Intake/Output from previous day: 08/06 0701 - 08/07 0700 In: 4398 [P.O.:1200; I.V.:2898; IV Piggyback:300] Out: 2750 [Urine:2700; Blood:50] Intake/Output this shift: Total I/O In: 120 [P.O.:120] Out: 400 [Urine:400]  Recent Labs    11/15/19 0256  HGB 13.5   Recent Labs    11/15/19 0256  WBC 12.6*  RBC 4.19*  HCT 38.5*  PLT 215   Recent Labs    11/15/19 0256  NA 136  K 3.5  CL 103  CO2 23  BUN 13  CREATININE 0.86  GLUCOSE 120*  CALCIUM 8.8*   No results for input(s): LABPT, INR in the last 72 hours.  Sensation intact distally Intact pulses distally Dorsiflexion/Plantar flexion intact Incision: dressing C/D/I No cellulitis present Compartment soft   Assessment/Plan: 1 Day Post-Op Procedure(s) (LRB): RIGHT TOTAL KNEE ARTHROPLASTY (Right) Up with therapy Discharge home with home health    Patient's anticipated LOS is less than 2 midnights, meeting these requirements: - Younger than 45 - Lives within 1 hour of care - Has a competent adult at home to recover with post-op recover - NO history of  - Chronic pain requiring opiods  - Diabetes  - Coronary Artery Disease  - Heart failure  - Heart attack  - Stroke  - DVT/VTE  - Cardiac arrhythmia  - Respiratory Failure/COPD  - Renal failure  - Anemia  - Advanced Liver disease       Mcarthur Rossetti 11/15/2019, 11:00 AM

## 2019-11-15 NOTE — Discharge Instructions (Signed)

## 2019-11-15 NOTE — Progress Notes (Signed)
Physical Therapy Treatment Patient Details Name: Brandon Franco MRN: [id number] DOB: [birth year] Today's Date: 11/15/2019    History of Present Illness Pt s/p R TKR and iwth hx of R BBB, CKD and R THR (14)    PT Comments    Pt progressing well with mobility and hopeful for dc home this date.   Follow Up Recommendations  Home health PT;Follow surgeon's recommendation for DC plan and follow-up therapies     Equipment Recommendations  None recommended by PT    Recommendations for Other Services       Precautions / Restrictions Precautions Precautions: Fall;Knee Restrictions Weight Bearing Restrictions: No RLE Weight Bearing: Weight bearing as tolerated    Mobility  Bed Mobility Overal bed mobility: Needs Assistance Bed Mobility: Supine to Sit     Supine to sit: Supervision     General bed mobility comments: Pt self assisting R LE with UEs  Transfers Overall transfer level: Needs assistance Equipment used: Rolling walker (2 wheeled) Transfers: Sit to/from Stand Sit to Stand: Min guard         General transfer comment: cues for LE management and use of UEs to self assist  Ambulation/Gait Ambulation/Gait assistance: Min guard Gait Distance (Feet): 200 Feet Assistive device: Rolling walker (2 wheeled) Gait Pattern/deviations: Decreased step length - right;Decreased step length - left;Shuffle;Trunk flexed;Step-to pattern;Step-through pattern     General Gait Details: cues for posture, position from RW and initial sequence   Stairs             Wheelchair Mobility    Modified Rankin (Stroke Patients Only)       Balance Overall balance assessment: Mild deficits observed, not formally tested                                          Cognition Arousal/Alertness: Awake/alert Behavior During Therapy: WFL for tasks assessed/performed Overall Cognitive Status: Within Functional Limits for tasks assessed                                         Exercises Total Joint Exercises Ankle Circles/Pumps: AROM;Both;15 reps;Supine Quad Sets: AROM;Both;10 reps;Supine Heel Slides: AAROM;Right;15 reps;Supine Straight Leg Raises: AAROM;AROM;Right;10 reps;Supine Long Arc Quad: AAROM;AROM;Right;10 reps;Seated Goniometric ROM: -6 - 85    General Comments        Pertinent Vitals/Pain Pain Assessment: 0-10 Pain Score: 5  Pain Location: R knee Pain Descriptors / Indicators: Aching;Sore Pain Intervention(s): Limited activity within patient's tolerance;Monitored during session;Premedicated before session;Ice applied    Home Living                      Prior Function            PT Goals (current goals can now be found in the care plan section) Acute Rehab PT Goals Patient Stated Goal: Regain IND PT Goal Formulation: With patient Time For Goal Achievement: 11/21/19 Potential to Achieve Goals: Good Progress towards PT goals: Progressing toward goals    Frequency    7X/week      PT Plan Current plan remains appropriate    Co-evaluation              AM-PAC PT "6 Clicks" Mobility   Outcome Measure  Help needed turning from your back to your side while  in a flat bed without using bedrails?: A Little Help needed moving from lying on your back to sitting on the side of a flat bed without using bedrails?: A Little Help needed moving to and from a bed to a chair (including a wheelchair)?: A Little Help needed standing up from a chair using your arms (e.g., wheelchair or bedside chair)?: A Little Help needed to walk in hospital room?: A Little Help needed climbing 3-5 steps with a railing? : A Little 6 Click Score: 18    End of Session Equipment Utilized During Treatment: Gait belt Activity Tolerance: Patient tolerated treatment well Patient left: in chair;with call bell/phone within reach;with chair alarm set Nurse Communication: Mobility status PT Visit Diagnosis: Difficulty in  walking, not elsewhere classified (R26.2)     Time: 5427-0623 PT Time Calculation (min) (ACUTE ONLY): 31 min  Charges:  $Gait Training: 8-22 mins $Therapeutic Exercise: 8-22 mins                     [place] Pager [phone] Office [phone]    Eschol Auxier 11/15/2019, 12:47 PM

## 2019-11-15 NOTE — Discharge Summary (Signed)
Patient ID: Dyer Klug MRN: [id number] DOB/AGE: [birth year] 59 y.o.  Admit date: 11/14/2019 Discharge date: 11/15/2019  Admission Diagnoses:  Principal Problem:   Primary osteoarthritis of right knee Active Problems:   Status post total right knee replacement   Discharge Diagnoses:  Same  Past Medical History:  Diagnosis Date  . Arthritis   . Chronic kidney disease    kidney stone  . Depression   . GERD (gastroesophageal reflux disease)   . History of kidney stones   . Hypertension   . IBS (irritable bowel syndrome)   . Prostate cancer [facility])    prostate  . Right bundle branch block     Surgeries: Procedure(s): RIGHT TOTAL KNEE ARTHROPLASTY on 11/14/2019   Consultants:   Discharged Condition: Improved  Hospital Course: Zakai Gonyea is an 59 y.o. male who was admitted 11/14/2019 for operative treatment ofPrimary osteoarthritis of right knee. Patient has severe unremitting pain that affects sleep, daily activities, and work/hobbies. After pre-op clearance the patient was taken to the operating room on 11/14/2019 and underwent  Procedure(s): RIGHT TOTAL KNEE ARTHROPLASTY.    Patient was given perioperative antibiotics:  Anti-infectives (From admission, onward)   Start     Dose/Rate Route Frequency Ordered Stop   11/14/19 1430  ceFAZolin (ANCEF) IVPB 1 g/50 mL premix        1 g 100 mL/hr over 30 Minutes Intravenous Every 6 hours 11/14/19 1156 11/15/19 0755   11/14/19 0600  ceFAZolin (ANCEF) IVPB 2g/100 mL premix        2 g 200 mL/hr over 30 Minutes Intravenous On call to O.R. 11/14/19 0559 11/14/19 3007       Patient was given sequential compression devices, early ambulation, and chemoprophylaxis to prevent DVT.  Patient benefited maximally from hospital stay and there were no complications.    Recent vital signs:  Patient Vitals for the past 24 hrs:  BP Temp Temp src Pulse Resp SpO2  11/15/19 0954 118/70 98.1 F (36.7 C) Oral 73 17 98 %  11/15/19 0524 120/71 97.6  F (36.4 C) Oral (!) 56 16 99 %  11/15/19 0142 113/66 97.9 F (36.6 C) -- (!) 47 18 98 %  11/14/19 2133 139/78 97.9 F (36.6 C) Oral 87 18 97 %  11/14/19 1539 117/70 98.3 F (36.8 C) Oral (!) 59 18 98 %  11/14/19 1403 122/75 97.7 F (36.5 C) Oral (!) 50 16 100 %  11/14/19 1326 121/78 97.6 F (36.4 C) Oral (!) 50 17 100 %  11/14/19 1200 123/72 97.9 F (36.6 C) Oral (!) 49 17 100 %  11/14/19 1137 -- 98 F (36.7 C) -- (!) 53 13 99 %  11/14/19 1130 123/80 -- -- (!) 51 13 100 %  11/14/19 1115 130/89 -- -- (!) 58 18 100 %     Recent laboratory studies:  Recent Labs    11/15/19 0256  WBC 12.6*  HGB 13.5  HCT 38.5*  PLT 215  NA 136  K 3.5  CL 103  CO2 23  BUN 13  CREATININE 0.86  GLUCOSE 120*  CALCIUM 8.8*     Discharge Medications:   Allergies as of 11/15/2019      Reactions   Lisinopril Cough      Medication List    STOP taking these medications   aspirin 81 MG tablet Replaced by: aspirin 325 MG EC tablet     TAKE these medications   aspirin 325 MG EC tablet Take 1 tablet (325 mg total) by  mouth 2 (two) times daily at 10 am and 4 pm. Replaces: aspirin 81 MG tablet   Glucosamine-Chondroitin Tabs Take 1 tablet by mouth daily.   methocarbamol 500 MG tablet Commonly known as: ROBAXIN Take 1 tablet (500 mg total) by mouth every 6 (six) hours as needed for muscle spasms.   omeprazole 20 MG capsule Commonly known as: PRILOSEC TAKE 1 CAPSULE BY MOUTH EVERY DAY What changed:   how much to take  how to take this  when to take this  additional instructions   oxyCODONE 5 MG immediate release tablet Commonly known as: Oxy IR/ROXICODONE Take 1-2 tablets (5-10 mg total) by mouth every 4 (four) hours as needed for moderate pain (pain score 4-6).   sildenafil 100 MG tablet Commonly known as: VIAGRA Take 100 mg by mouth daily as needed for erectile dysfunction.   tadalafil 5 MG tablet Commonly known as: CIALIS Take 5 mg by mouth daily.    valsartan-hydrochlorothiazide 160-12.5 MG tablet Commonly known as: DIOVAN-HCT Take 1 tablet by mouth daily.            Durable Medical Equipment  (From admission, onward)         Start     Ordered   11/14/19 1157  DME 3 n 1  Once        11/14/19 1156   11/14/19 1157  DME Walker rolling  Once       Question Answer Comment  Walker: With 5 Inch Wheels   Patient needs a walker to treat with the following condition Status post total right knee replacement      11/14/19 1156          Diagnostic Studies: DG Knee Right Port  Result Date: 11/14/2019 CLINICAL DATA:  Status post right knee replacement EXAM: PORTABLE RIGHT KNEE - 1-2 VIEW COMPARISON:  None. FINDINGS: Right knee prosthesis is noted in satisfactory position. No acute fracture is noted. No soft tissue abnormality is seen. IMPRESSION: Status post right knee replacement Electronically Signed   By: Inez Catalina M.D.   On: 11/14/2019 10:52    Disposition: Discharge disposition: 01-Home or [facility] [place] Follow up.   Specialty: [place] Why: to provide home health physical therapy Contact information: [address] [place] [address] [phone]        Mcarthur Rossetti, MD Follow up in 2 week(s).   Specialty: Orthopedic Surgery Contact information: [address] [place] [address] [phone]                Signed: Mcarthur Rossetti 11/15/2019, 11:01 AM

## 2019-11-15 NOTE — Progress Notes (Signed)
Orthopedic Tech Progress Note Patient Details:  Brandon Franco [birth year] [id number]  CPM Right Knee CPM Right Knee: Off Right Knee Flexion (Degrees): 90 Right Knee Extension (Degrees): 0 Additional Comments: Pt going home  Post Interventions Patient Tolerated: Well Instructions Provided: Care of device  [name] 11/15/2019, 12:27 PM

## 2019-11-17 ENCOUNTER — Encounter (HOSPITAL_COMMUNITY): Payer: Self-pay | Admitting: Orthopaedic Surgery

## 2019-11-19 ENCOUNTER — Telehealth: Payer: Self-pay

## 2019-11-19 MED ORDER — OXYCODONE HCL 5 MG PO TABS: 5.0000 mg | ORAL_TABLET | ORAL | 0 refills | Status: DC | PRN

## 2019-11-19 NOTE — Telephone Encounter (Signed)
Patient called in needing refill   oxycodone

## 2019-11-19 NOTE — Telephone Encounter (Signed)
Please advise 

## 2019-11-19 NOTE — Telephone Encounter (Signed)
I sent it in 

## 2019-11-20 ENCOUNTER — Telehealth: Payer: Self-pay | Admitting: Orthopaedic Surgery

## 2019-11-20 NOTE — Telephone Encounter (Signed)
Patient called.   He is being discharged from PT in the rehab facility and now needs a referral to start outpatient PT  Call back: [phone]

## 2019-11-21 ENCOUNTER — Other Ambulatory Visit: Payer: Self-pay

## 2019-11-21 DIAGNOSIS — Z96651 Presence of right artificial knee joint: Secondary | ICD-10-CM

## 2019-11-21 NOTE — Telephone Encounter (Signed)
Order sent.

## 2019-11-27 ENCOUNTER — Ambulatory Visit (INDEPENDENT_AMBULATORY_CARE_PROVIDER_SITE_OTHER): Payer: BC Managed Care – PPO | Admitting: Physician Assistant

## 2019-11-27 ENCOUNTER — Encounter: Payer: Self-pay | Admitting: Physician Assistant

## 2019-11-27 DIAGNOSIS — Z96651 Presence of right artificial knee joint: Secondary | ICD-10-CM

## 2019-11-27 NOTE — Progress Notes (Signed)
HPI: Brandon Franco returns today 2 weeks status post right total knee arthroplasty.  He is overall doing well.  He is ambulating with a cane.  He brings in his physical therapy notes he is flexing to 122 degrees after warming up.  He states he is only taking oxycodone at night.  He has been taking aspirin for DVT prophylaxis he was on an 81 mg aspirin daily prior to surgery.  He has had no fevers chills.  Physical exam: Right knee near full extension flexion placed 220 degrees.  Right calf supple nontender.  Dorsiflexion plantarflexion right ankle intact.  Surgical incisions healing well well approximated staples without wound dehiscence.  Impression: Status post right total knee arthroplasty 11/14/2019  Plan: He will continue to work on range of motion strengthening I did discuss with him to not overdo things.  Staples were removed Steri-Strips applied.  Work on TEFL teacher.  As it is his right leg he should refrain from driving until at least 6 weeks postop.  We will see him back in 4 weeks sooner if there is any questions concerns.

## 2019-12-03 ENCOUNTER — Encounter: Payer: Self-pay | Admitting: Physical Therapy

## 2019-12-03 ENCOUNTER — Other Ambulatory Visit: Payer: Self-pay

## 2019-12-03 ENCOUNTER — Ambulatory Visit (INDEPENDENT_AMBULATORY_CARE_PROVIDER_SITE_OTHER): Payer: BC Managed Care – PPO | Admitting: Physical Therapy

## 2019-12-03 DIAGNOSIS — M25561 Pain in right knee: Secondary | ICD-10-CM | POA: Diagnosis not present

## 2019-12-03 DIAGNOSIS — R2689 Other abnormalities of gait and mobility: Secondary | ICD-10-CM

## 2019-12-03 DIAGNOSIS — M25661 Stiffness of right knee, not elsewhere classified: Secondary | ICD-10-CM

## 2019-12-03 NOTE — Therapy (Signed)
[facility] [address] [place], [place], [address] Phone: [phone]   Fax:  [phone]  Physical Therapy Evaluation  Patient Details  Name: Brandon Franco MRN: [id number] Date of Birth: 1960/11/13 Referring Provider (PT): Kathrynn Speed   Encounter Date: 12/03/2019   PT End of Session - 12/03/19 1226    Visit Number 1    Number of Visits 12    Date for PT Re-Evaluation 01/14/20    Authorization Type BC[employer]    PT Start Time 1017    PT Stop Time 1100    PT Time Calculation (min) 43 min    Activity Tolerance Patient tolerated treatment well    Behavior During Therapy [facility] for tasks assessed/performed           Past Medical History:  Diagnosis Date   Arthritis    Chronic kidney disease    kidney stone   Depression    GERD (gastroesophageal reflux disease)    History of kidney stones    Hypertension    IBS (irritable bowel syndrome)    Prostate cancer ([place])    prostate   Right bundle branch block     Past Surgical History:  Procedure Laterality Date   APPENDECTOMY     COLONOSCOPY     HERNIA REPAIR     PROSTATECTOMY  2016   SHOULDER ARTHROSCOPY Right    TOTAL HIP ARTHROPLASTY  04/26/2012   Procedure: TOTAL HIP ARTHROPLASTY ANTERIOR APPROACH;  Surgeon: Mcarthur Rossetti, MD;  Location: [facility];  Service: Orthopedics;  Laterality: Right;  Right Total Hip Arthroplasty   TOTAL KNEE ARTHROPLASTY Right 11/14/2019   Procedure: RIGHT TOTAL KNEE ARTHROPLASTY;  Surgeon: Mcarthur Rossetti, MD;  Location: [facility];  Service: Orthopedics;  Laterality: Right;    There were no vitals filed for this visit.    Subjective Assessment - 12/03/19 1024    Subjective Pt had R TKA on 8/6. Doing very well, had home health for 2 weeks. Using cane today, not driving yet.    Currently in Pain? Yes    Pain Score 4     Pain Location Knee    Pain Orientation Right    Pain Descriptors / Indicators Aching    Pain Type Acute  pain    Pain Onset More than a month ago    Pain Frequency Intermittent              [facility] PT Assessment - 12/03/19 0001      Assessment   Medical Diagnosis R TKA    Referring Provider (PT) C Blackman    Onset Date/Surgical Date 11/14/19    Prior Therapy for HIp      Balance Screen   Has the patient fallen in the past 6 months No      Prior Function   Level of Independence Independent      Cognition   Overall Cognitive Status Within Functional Limits for tasks assessed      ROM / Strength   AROM / PROM / Strength AROM;Strength      AROM   AROM Assessment Site Knee    Right/Left Knee Right;Left    Right Knee Extension -3    Right Knee Flexion 127    Left Knee Extension 3    Left Knee Flexion 140      Strength   Strength Assessment Site Knee    Right/Left Knee Right;Left    Right Knee Flexion 4/5    Right Knee Extension  4-/5    Left Knee Flexion 5/5    Left Knee Extension 5/5      Palpation   Palpation comment Steri strips in place, incision healing well.  Mild stiffness and lack of Full Extension. Mild swelling in knee and calf, mild tightenss and bruising in gastroc       Ambulation/Gait   Gait Comments Ambulation, no AD, decreased push off,  mild circumduction with R foot and LE, decreased hip and knee flexion.                       Objective measurements completed on examination: See above findings.       [facility] Adult PT Treatment/Exercise - 12/03/19 0001      Exercises   Exercises Knee/Hip      Knee/Hip Exercises: Stretches   Active Hamstring Stretch 3 reps;30 seconds    Active Hamstring Stretch Limitations seated      Knee/Hip Exercises: Seated   Long Arc Quad 10 reps;Right      Knee/Hip Exercises: Supine   Quad Sets 20 reps    Heel Slides 20 reps    Straight Leg Raises 20 reps;Right    Straight Leg Raises Limitations with slight ER      Manual Therapy   Manual Therapy Passive ROM    Passive ROM PROM R knee for flex and Ext                    PT Education - 12/03/19 1226    Education Details Educated on initial HEP, has pictures from home health    Person(s) Educated Patient    Methods Explanation;Demonstration;Verbal cues;Tactile cues    Comprehension Verbalized understanding;Returned demonstration;Verbal cues required;Tactile cues required            PT Short Term Goals - 12/03/19 1228      PT SHORT TERM GOAL #1   Title Pt to be independent with initiL HEP    Time 2    Period Weeks    Status New    Target Date 12/17/19             PT Long Term Goals - 12/03/19 1229      PT LONG TERM GOAL #1   Title Pt to be independent with final HEP    Time 6    Period Weeks    Status New    Target Date 01/14/20      PT LONG TERM GOAL #2   Title Pt to demo improved ROM of R knee for Flex and Ext to be WNL    Time 6    Period Weeks    Status New    Target Date 01/14/20      PT LONG TERM GOAL #3   Title Pt to demo improved strength of R hip and knee, to be at least 4+/5 to improve stability and gait    Time 6    Period Weeks    Status New    Target Date 01/14/20      PT LONG TERM GOAL #4   Title Pt to demo improved gait mechanics for pt age, to be [facility], without AD, for community distances    Time 6    Period Weeks    Status New    Target Date 01/14/20      PT LONG TERM GOAL #5   Title Pt to demo independent/safe ability for stair negotiation, with recipricol patter, and without hand  rail, for improved community activity    Time 6    Period Weeks    Status New    Target Date 01/14/20                  Plan - 12/03/19 1231    Clinical Impression Statement Pt presetns wtih deficts following R TKA on 11/14/19. Pt doing excellent so far. He has upt o 127 deg of flexion today, is lacking mild extension, and has little pain. Pt with decreased strength, balance, ambulation mecanics and decreased abilty for full functional activites, stairs, and community activity. Discussed going  slow, and not over doing activity at this time as pt is anxious to return to more activity. Pt to benefit form skilled PT to improve deficits and pain.    Examination-Activity Limitations Locomotion Level;Sleep;Squat;Lift;Stand;Stairs    Examination-Participation Restrictions Meal Prep;Cleaning;Community Activity;Driving;Shop;Laundry;Yard Work    Stability/Clinical Decision Making Stable/Uncomplicated    Clinical Decision Making Low    Rehab Potential Good    PT Frequency 2x / week    PT Duration 6 weeks    PT Treatment/Interventions ADLs/Self Care Home Management;Cryotherapy;Electrical Stimulation;Gait training;DME Instruction;Ultrasound;Traction;Moist Heat;Iontophoresis 4mg /ml Dexamethasone;Stair training;Functional mobility training;Therapeutic activities;Therapeutic exercise;Balance training;Neuromuscular re-education;Manual techniques;Orthotic Fit/Training;Patient/family education;Passive range of motion;Dry needling;Taping;Joint Manipulations;Spinal Manipulations;Vestibular    Consulted and Agree with Plan of Care Patient           Patient will benefit from skilled therapeutic intervention in order to improve the following deficits and impairments:  Abnormal gait, Decreased range of motion, Decreased activity tolerance, Pain, Decreased balance, Impaired flexibility, Decreased strength, Decreased mobility  Visit Diagnosis: Acute pain of right knee  Stiffness of right knee, not elsewhere classified  Other abnormalities of gait and mobility     Problem List Patient Active Problem List   Diagnosis Date Noted   Status post total right knee replacement 11/14/2019   Primary osteoarthritis of right knee 10/01/2019   Primary osteoarthritis of left knee 10/01/2019   Unilateral primary osteoarthritis, left knee 12/17/2018   Gastroesophageal reflux disease 12/07/2016   Elevated LFTs 12/07/2016   Acute pain of right knee 09/27/2016   Chronic bilateral low back pain without  sciatica 09/27/2016   Degenerative arthritis of hip 04/26/2012    Lyndee Hensen, PT, DPT 12:37 PM  12/03/19    [place], [place], [address] Phone: [phone]   Fax:  [phone]  Name: Brandon Franco MRN: [id number] Date of Birth: [birth year]

## 2019-12-04 ENCOUNTER — Other Ambulatory Visit: Payer: Self-pay | Admitting: Orthopaedic Surgery

## 2019-12-04 ENCOUNTER — Encounter: Payer: Self-pay | Admitting: Orthopaedic Surgery

## 2019-12-04 MED ORDER — ZOLPIDEM TARTRATE ER 6.25 MG PO TBCR: 6.2500 mg | EXTENDED_RELEASE_TABLET | Freq: Every evening | ORAL | 0 refills | Status: DC | PRN

## 2019-12-09 ENCOUNTER — Other Ambulatory Visit: Payer: Self-pay

## 2019-12-09 ENCOUNTER — Encounter: Payer: Self-pay | Admitting: Physical Therapy

## 2019-12-09 ENCOUNTER — Ambulatory Visit (INDEPENDENT_AMBULATORY_CARE_PROVIDER_SITE_OTHER): Payer: BC Managed Care – PPO | Admitting: Physical Therapy

## 2019-12-09 DIAGNOSIS — R2689 Other abnormalities of gait and mobility: Secondary | ICD-10-CM

## 2019-12-09 DIAGNOSIS — M25661 Stiffness of right knee, not elsewhere classified: Secondary | ICD-10-CM

## 2019-12-09 DIAGNOSIS — M25561 Pain in right knee: Secondary | ICD-10-CM

## 2019-12-09 NOTE — Therapy (Signed)
[facility] [address] [place], [place], [address] Phone: [phone]   Fax:  [phone]  Physical Therapy Treatment  Patient Details  Name: Brandon Franco MRN: [id number] Date of Birth: April 30, 1960 Referring Provider (PT): Kathrynn Speed   Encounter Date: 12/09/2019   PT End of Session - 12/09/19 1026    Visit Number 2    Number of Visits 12    Date for PT Re-Evaluation 01/14/20    Authorization Type BCBS    PT Start Time 1017    PT Stop Time 1100    PT Time Calculation (min) 43 min    Activity Tolerance Patient tolerated treatment well    Behavior During Therapy [facility] for tasks assessed/performed           Past Medical History:  Diagnosis Date  . Arthritis   . Chronic kidney disease    kidney stone  . Depression   . GERD (gastroesophageal reflux disease)   . History of kidney stones   . Hypertension   . IBS (irritable bowel syndrome)   . Prostate cancer [facility])    prostate  . Right bundle branch block     Past Surgical History:  Procedure Laterality Date  . APPENDECTOMY    . COLONOSCOPY    . HERNIA REPAIR    . PROSTATECTOMY  2016  . SHOULDER ARTHROSCOPY Right   . TOTAL HIP ARTHROPLASTY  04/26/2012   Procedure: TOTAL HIP ARTHROPLASTY ANTERIOR APPROACH;  Surgeon: Mcarthur Rossetti, MD;  Location: [facility];  Service: Orthopedics;  Laterality: Right;  Right Total Hip Arthroplasty  . TOTAL KNEE ARTHROPLASTY Right 11/14/2019   Procedure: RIGHT TOTAL KNEE ARTHROPLASTY;  Surgeon: Mcarthur Rossetti, MD;  Location: [facility];  Service: Orthopedics;  Laterality: Right;    There were no vitals filed for this visit.   Subjective Assessment - 12/09/19 1025    Subjective Pt doing well, obtained new shoes. Still not sleeping great.    Currently in Pain? Yes    Pain Score 3     Pain Location Knee    Pain Orientation Right    Pain Descriptors / Indicators Aching    Pain Type Acute pain    Pain Onset More than a month ago     Pain Frequency Intermittent                             [facility] Adult PT Treatment/Exercise - 12/09/19 1026      Ambulation/Gait   Gait Comments Ambulation, no AD, decreased push off,  mild circumduction with R foot and LE, decreased hip and knee flexion.       Exercises   Exercises Knee/Hip      Knee/Hip Exercises: Stretches   Active Hamstring Stretch 3 reps;30 seconds    Active Hamstring Stretch Limitations seated      Knee/Hip Exercises: Aerobic   Recumbent Bike L2 x 10 min       Knee/Hip Exercises: Standing   Heel Raises 20 reps    Hip Flexion 20 reps;Knee bent    Hip Abduction 2 sets;10 reps    Stairs up/down 5 steps 1 hand rail, 6 in x 5;     Other Standing Knee Exercises Walk/March 10 ft x 4;       Knee/Hip Exercises: Seated   Long Arc Quad Right;20 reps    Long Arc Quad Limitations 1.5 lb      Knee/Hip Exercises: Supine  Quad Sets 20 reps    Heel Slides 20 reps    Straight Leg Raises 20 reps;Right    Straight Leg Raises Limitations with slight ER      Manual Therapy   Manual Therapy Passive ROM;Joint mobilization    Joint Mobilization to increase knee ext     Passive ROM PROM R knee for flex and Ext                     PT Short Term Goals - 12/03/19 1228      PT SHORT TERM GOAL #1   Title Pt to be independent with initiL HEP    Time 2    Period Weeks    Status New    Target Date 12/17/19             PT Long Term Goals - 12/03/19 1229      PT LONG TERM GOAL #1   Title Pt to be independent with final HEP    Time 6    Period Weeks    Status New    Target Date 01/14/20      PT LONG TERM GOAL #2   Title Pt to demo improved ROM of R knee for Flex and Ext to be WNL    Time 6    Period Weeks    Status New    Target Date 01/14/20      PT LONG TERM GOAL #3   Title Pt to demo improved strength of R hip and knee, to be at least 4+/5 to improve stability and gait    Time 6    Period Weeks    Status New    Target Date  01/14/20      PT LONG TERM GOAL #4   Title Pt to demo improved gait mechanics for pt age, to be [facility], without AD, for community distances    Time 6    Period Weeks    Status New    Target Date 01/14/20      PT LONG TERM GOAL #5   Title Pt to demo independent/safe ability for stair negotiation, with recipricol patter, and without hand rail, for improved community activity    Time 6    Period Weeks    Status New    Target Date 01/14/20                 Plan - 12/09/19 1117    Clinical Impression Statement Pt progressing well.  THer ex progressed for strength and ROM today. Pt lacking mild Extension, will continue to focus on ROM and strength.    Examination-Activity Limitations Locomotion Level;Sleep;Squat;Lift;Stand;Stairs    Examination-Participation Restrictions Meal Prep;Cleaning;Community Activity;Driving;Shop;Laundry;Yard Work    Stability/Clinical Decision Making Stable/Uncomplicated    Rehab Potential Good    PT Frequency 2x / week    PT Duration 6 weeks    PT Treatment/Interventions ADLs/Self Care Home Management;Cryotherapy;Electrical Stimulation;Gait training;DME Instruction;Ultrasound;Traction;Moist Heat;Iontophoresis 4mg /ml Dexamethasone;Stair training;Functional mobility training;Therapeutic activities;Therapeutic exercise;Balance training;Neuromuscular re-education;Manual techniques;Orthotic Fit/Training;Patient/family education;Passive range of motion;Dry needling;Taping;Joint Manipulations;Spinal Manipulations;Vestibular    Consulted and Agree with Plan of Care Patient           Patient will benefit from skilled therapeutic intervention in order to improve the following deficits and impairments:  Abnormal gait, Decreased range of motion, Decreased activity tolerance, Pain, Decreased balance, Impaired flexibility, Decreased strength, Decreased mobility  Visit Diagnosis: Acute pain of right knee  Stiffness of right knee, not elsewhere classified  Other  abnormalities of gait and mobility     Problem List Patient Active Problem List   Diagnosis Date Noted  . Status post total right knee replacement 11/14/2019  . Primary osteoarthritis of right knee 10/01/2019  . Primary osteoarthritis of left knee 10/01/2019  . Unilateral primary osteoarthritis, left knee 12/17/2018  . Gastroesophageal reflux disease 12/07/2016  . Elevated LFTs 12/07/2016  . Acute pain of right knee 09/27/2016  . Chronic bilateral low back pain without sciatica 09/27/2016  . Degenerative arthritis of hip 04/26/2012    Lyndee Hensen, PT, DPT 11:56 AM  12/09/19    [facility] [place], [place], [address] Phone: [phone]   Fax:  [phone]  Name: Brandon Franco MRN: [id number] Date of Birth: 1960-09-11

## 2019-12-11 ENCOUNTER — Encounter: Payer: Self-pay | Admitting: Physical Therapy

## 2019-12-11 ENCOUNTER — Ambulatory Visit (INDEPENDENT_AMBULATORY_CARE_PROVIDER_SITE_OTHER): Payer: BC Managed Care – PPO | Admitting: Physical Therapy

## 2019-12-11 ENCOUNTER — Other Ambulatory Visit: Payer: Self-pay

## 2019-12-11 DIAGNOSIS — M25661 Stiffness of right knee, not elsewhere classified: Secondary | ICD-10-CM | POA: Diagnosis not present

## 2019-12-11 DIAGNOSIS — M25561 Pain in right knee: Secondary | ICD-10-CM | POA: Diagnosis not present

## 2019-12-11 DIAGNOSIS — R2689 Other abnormalities of gait and mobility: Secondary | ICD-10-CM

## 2019-12-11 NOTE — Therapy (Signed)
[facility] [address] [place], [place], [address] Phone: [phone]   Fax:  [phone]  Physical Therapy Treatment  Patient Details  Name: Brandon Franco MRN: [id number] Date of Birth: 1960/07/01 Referring Provider (PT): Kathrynn Speed   Encounter Date: 12/11/2019   PT End of Session - 12/11/19 1112    Visit Number 3    Number of Visits 12    Date for PT Re-Evaluation 01/14/20    Authorization Type BCBS    PT Start Time 1105    PT Stop Time 1148    PT Time Calculation (min) 43 min    Activity Tolerance Patient tolerated treatment well    Behavior During Therapy [facility] for tasks assessed/performed           Past Medical History:  Diagnosis Date  . Arthritis   . Chronic kidney disease    kidney stone  . Depression   . GERD (gastroesophageal reflux disease)   . History of kidney stones   . Hypertension   . IBS (irritable bowel syndrome)   . Prostate cancer [facility])    prostate  . Right bundle branch block     Past Surgical History:  Procedure Laterality Date  . APPENDECTOMY    . COLONOSCOPY    . HERNIA REPAIR    . PROSTATECTOMY  2016  . SHOULDER ARTHROSCOPY Right   . TOTAL HIP ARTHROPLASTY  04/26/2012   Procedure: TOTAL HIP ARTHROPLASTY ANTERIOR APPROACH;  Surgeon: Mcarthur Rossetti, MD;  Location: [facility];  Service: Orthopedics;  Laterality: Right;  Right Total Hip Arthroplasty  . TOTAL KNEE ARTHROPLASTY Right 11/14/2019   Procedure: RIGHT TOTAL KNEE ARTHROPLASTY;  Surgeon: Mcarthur Rossetti, MD;  Location: [facility];  Service: Orthopedics;  Laterality: Right;    There were no vitals filed for this visit.   Subjective Assessment - 12/11/19 1110    Subjective No new complaints    Currently in Pain? Yes    Pain Score 3     Pain Location Knee    Pain Orientation Right    Pain Descriptors / Indicators Aching    Pain Type Acute pain    Pain Onset More than a month ago    Pain Frequency Intermittent               [facility] PT Assessment - 12/11/19 1114      AROM   Right Knee Extension 0    Right Knee Flexion 130                         [facility] Adult PT Treatment/Exercise - 12/11/19 1114      Ambulation/Gait   Gait Comments Ambulation, no AD, decreased push off,  mild circumduction with R foot and LE, decreased hip and knee flexion.       Exercises   Exercises Knee/Hip      Knee/Hip Exercises: Stretches   Active Hamstring Stretch 3 reps;30 seconds    Active Hamstring Stretch Limitations seated      Knee/Hip Exercises: Aerobic   Recumbent Bike L2 x 10 min       Knee/Hip Exercises: Standing   Heel Raises 20 reps    Hip Flexion 20 reps;Knee bent    Hip Abduction 2 sets;10 reps    Forward Step Up 15 reps;Step Height: 6";Hand Hold: 1    Stairs up/down 5 steps 1 hand rail, 6 in x 5;     Other Standing Knee Exercises  Walk/March 10 ft x 4;     Other Standing Knee Exercises Step up onto AirEx x 20 w R       Knee/Hip Exercises: Seated   Long Arc Quad Right;20 reps    Long Arc Quad Limitations 2.5 lb    Hamstring Curl 20 reps;Both    Hamstring Limitations GTB      Knee/Hip Exercises: Supine   Quad Sets --    Heel Slides 20 reps    Straight Leg Raises 20 reps;Right    Straight Leg Raises Limitations with slight ER      Knee/Hip Exercises: Sidelying   Hip ABduction 20 reps;Both      Manual Therapy   Manual Therapy Passive ROM;Joint mobilization    Joint Mobilization to increase knee ext     Passive ROM PROM R knee for flex and Ext                     PT Short Term Goals - 12/03/19 1228      PT SHORT TERM GOAL #1   Title Pt to be independent with initiL HEP    Time 2    Period Weeks    Status New    Target Date 12/17/19             PT Long Term Goals - 12/03/19 1229      PT LONG TERM GOAL #1   Title Pt to be independent with final HEP    Time 6    Period Weeks    Status New    Target Date 01/14/20      PT LONG TERM GOAL #2   Title Pt  to demo improved ROM of R knee for Flex and Ext to be WNL    Time 6    Period Weeks    Status New    Target Date 01/14/20      PT LONG TERM GOAL #3   Title Pt to demo improved strength of R hip and knee, to be at least 4+/5 to improve stability and gait    Time 6    Period Weeks    Status New    Target Date 01/14/20      PT LONG TERM GOAL #4   Title Pt to demo improved gait mechanics for pt age, to be [facility], without AD, for community distances    Time 6    Period Weeks    Status New    Target Date 01/14/20      PT LONG TERM GOAL #5   Title Pt to demo independent/safe ability for stair negotiation, with recipricol patter, and without hand rail, for improved community activity    Time 6    Period Weeks    Status New    Target Date 01/14/20                 Plan - 12/11/19 1159    Clinical Impression Statement Pt progressing well with ROM and strengthening. Minimal pain with exercise, stil have soreness with sleeping. Plan to progress as tolerated    Examination-Activity Limitations Locomotion Level;Sleep;Squat;Lift;Stand;Stairs    Examination-Participation Restrictions Meal Prep;Cleaning;Community Activity;Driving;Shop;Laundry;Yard Work    Stability/Clinical Decision Making Stable/Uncomplicated    Rehab Potential Good    PT Frequency 2x / week    PT Duration 6 weeks    PT Treatment/Interventions ADLs/Self Care Home Management;Cryotherapy;Electrical Stimulation;Gait training;DME Instruction;Ultrasound;Traction;Moist Heat;Iontophoresis 4mg /ml Dexamethasone;Stair training;Functional mobility training;Therapeutic activities;Therapeutic exercise;Balance training;Neuromuscular re-education;Manual techniques;Orthotic Fit/Training;Patient/family education;Passive range  of motion;Dry needling;Taping;Joint Manipulations;Spinal Manipulations;Vestibular    Consulted and Agree with Plan of Care Patient           Patient will benefit from skilled therapeutic intervention in order to  improve the following deficits and impairments:  Abnormal gait, Decreased range of motion, Decreased activity tolerance, Pain, Decreased balance, Impaired flexibility, Decreased strength, Decreased mobility  Visit Diagnosis: Acute pain of right knee  Stiffness of right knee, not elsewhere classified  Other abnormalities of gait and mobility     Problem List Patient Active Problem List   Diagnosis Date Noted  . Status post total right knee replacement 11/14/2019  . Primary osteoarthritis of right knee 10/01/2019  . Primary osteoarthritis of left knee 10/01/2019  . Unilateral primary osteoarthritis, left knee 12/17/2018  . Gastroesophageal reflux disease 12/07/2016  . Elevated LFTs 12/07/2016  . Acute pain of right knee 09/27/2016  . Chronic bilateral low back pain without sciatica 09/27/2016  . Degenerative arthritis of hip 04/26/2012    Lyndee Hensen, PT, DPT 12:02 PM  12/11/19    [facility] [place], [place], [address] Phone: [phone]   Fax:  [phone]  Name: Brandon Franco MRN: [id number] Date of Birth: February 09, 1961

## 2019-12-17 ENCOUNTER — Ambulatory Visit (INDEPENDENT_AMBULATORY_CARE_PROVIDER_SITE_OTHER): Payer: BC Managed Care – PPO | Admitting: Physical Therapy

## 2019-12-17 ENCOUNTER — Other Ambulatory Visit: Payer: Self-pay

## 2019-12-17 ENCOUNTER — Encounter: Payer: Self-pay | Admitting: Physical Therapy

## 2019-12-17 DIAGNOSIS — R2689 Other abnormalities of gait and mobility: Secondary | ICD-10-CM | POA: Diagnosis not present

## 2019-12-17 DIAGNOSIS — M25661 Stiffness of right knee, not elsewhere classified: Secondary | ICD-10-CM

## 2019-12-17 DIAGNOSIS — M25561 Pain in right knee: Secondary | ICD-10-CM

## 2019-12-17 NOTE — Therapy (Signed)
[facility] [address] [place], [place], [address] Phone: [phone]   Fax:  [phone]  Physical Therapy Treatment  Patient Details  Name: Brandon Franco MRN: [id number] Date of Birth: [birth year] Referring Provider (PT): Kathrynn Speed   Encounter Date: 12/17/2019   PT End of Session - 12/17/19 0924    Visit Number 4    Number of Visits 12    Date for PT Re-Evaluation 01/14/20    Authorization Type BCBS    PT Start Time 0848    PT Stop Time 0935    PT Time Calculation (min) 47 min    Activity Tolerance Patient tolerated treatment well    Behavior During Therapy [facility] for tasks assessed/performed           Past Medical History:  Diagnosis Date  . Arthritis   . Chronic kidney disease    kidney stone  . Depression   . GERD (gastroesophageal reflux disease)   . History of kidney stones   . Hypertension   . IBS (irritable bowel syndrome)   . Prostate cancer [facility])    prostate  . Right bundle branch block     Past Surgical History:  Procedure Laterality Date  . APPENDECTOMY    . COLONOSCOPY    . HERNIA REPAIR    . PROSTATECTOMY  2016  . SHOULDER ARTHROSCOPY Right   . TOTAL HIP ARTHROPLASTY  04/26/2012   Procedure: TOTAL HIP ARTHROPLASTY ANTERIOR APPROACH;  Surgeon: Mcarthur Rossetti, MD;  [facility]: [facility];  Service: Orthopedics;  Laterality: Right;  Right Total Hip Arthroplasty  . TOTAL KNEE ARTHROPLASTY Right 11/14/2019   Procedure: RIGHT TOTAL KNEE ARTHROPLASTY;  Surgeon: Mcarthur Rossetti, MD;  [facility]: [facility];  Service: Orthopedics;  Laterality: Right;    There were no vitals filed for this visit.   Subjective Assessment - 12/17/19 0923    Subjective No new complaints    Currently in Pain? Yes    Pain Score 1     Pain Location Knee    Pain Orientation Right    Pain Descriptors / Indicators Aching    Pain Type Acute pain    Pain Onset More than a month ago    Pain Frequency Intermittent                              [facility] Adult PT Treatment/Exercise - 12/17/19 0001      Knee/Hip Exercises: Stretches   Sports administrator 3 reps;30 seconds    Quad Stretch Limitations prone with strap      Knee/Hip Exercises: Aerobic   Recumbent Bike L2 x 10 min       Knee/Hip Exercises: Standing   Heel Raises 20 reps    Hip Abduction 2 sets;10 reps    Other Standing Knee Exercises Walk/March 10 ft x 4; fwd and bwd;   Bwd walking and side stepping 20 ft x 4 ea       Knee/Hip Exercises: Seated   Long Arc Quad Right;20 reps    Long Arc Quad Limitations 3    Hamstring Curl 20 reps;Both    Hamstring Limitations GTB      Knee/Hip Exercises: Supine   Quad Sets 20 reps    Straight Leg Raises 20 reps;Right    Straight Leg Raises Limitations with slight ER      Manual Therapy   Joint Mobilization to increase knee ext     Passive  ROM PROM R knee for flex and Ext                     PT Short Term Goals - 12/03/19 1228      PT SHORT TERM GOAL #1   Title Pt to be independent with initiL HEP    Time 2    Period Weeks    Status New    Target Date 12/17/19             PT Long Term Goals - 12/03/19 1229      PT LONG TERM GOAL #1   Title Pt to be independent with final HEP    Time 6    Period Weeks    Status New    Target Date 01/14/20      PT LONG TERM GOAL #2   Title Pt to demo improved ROM of R knee for Flex and Ext to be WNL    Time 6    Period Weeks    Status New    Target Date 01/14/20      PT LONG TERM GOAL #3   Title Pt to demo improved strength of R hip and knee, to be at least 4+/5 to improve stability and gait    Time 6    Period Weeks    Status New    Target Date 01/14/20      PT LONG TERM GOAL #4   Title Pt to demo improved gait mechanics for pt age, to be [facility], without AD, for community distances    Time 6    Period Weeks    Status New    Target Date 01/14/20      PT LONG TERM GOAL #5   Title Pt to demo independent/safe ability for  stair negotiation, with recipricol patter, and without hand rail, for improved community activity    Time 6    Period Weeks    Status New    Target Date 01/14/20                 Plan - 12/17/19 1203    Clinical Impression Statement Pt making good progress. Incision healing well. Discussed waiting another week for healing before going swimming. Will continue to progress strength and stability as toelrated.    Examination-Activity Limitations Locomotion Level;Sleep;Squat;Lift;Stand;Stairs    Examination-Participation Restrictions Meal Prep;Cleaning;Community Activity;Driving;Shop;Laundry;Yard Work    Stability/Clinical Decision Making Stable/Uncomplicated    Rehab Potential Good    PT Frequency 2x / week    PT Duration 6 weeks    PT Treatment/Interventions ADLs/Self Care Home Management;Cryotherapy;Electrical Stimulation;Gait training;DME Instruction;Ultrasound;Traction;Moist Heat;Iontophoresis 4mg /ml Dexamethasone;Stair training;Functional mobility training;Therapeutic activities;Therapeutic exercise;Balance training;Neuromuscular re-education;Manual techniques;Orthotic Fit/Training;Patient/family education;Passive range of motion;Dry needling;Taping;Joint Manipulations;Spinal Manipulations;Vestibular    Consulted and Agree with Plan of Care Patient           Patient will benefit from skilled therapeutic intervention in order to improve the following deficits and impairments:  Abnormal gait, Decreased range of motion, Decreased activity tolerance, Pain, Decreased balance, Impaired flexibility, Decreased strength, Decreased mobility  Visit Diagnosis: Acute pain of right knee  Stiffness of right knee, not elsewhere classified  Other abnormalities of gait and mobility     Problem List Patient Active Problem List   Diagnosis Date Noted  . Status post total right knee replacement 11/14/2019  . Primary osteoarthritis of right knee 10/01/2019  . Primary osteoarthritis of left  knee 10/01/2019  . Unilateral primary osteoarthritis, left knee 12/17/2018  .  Gastroesophageal reflux disease 12/07/2016  . Elevated LFTs 12/07/2016  . Acute pain of right knee 09/27/2016  . Chronic bilateral low back pain without sciatica 09/27/2016  . Degenerative arthritis of hip 04/26/2012    Lyndee Hensen, PT, DPT 12:04 PM  12/17/19    [facility] [place], [place], [address] Phone: [phone]   Fax:  [phone]  Name: Brandon Franco MRN: [id number] Date of Birth: 1960-08-24

## 2019-12-19 ENCOUNTER — Other Ambulatory Visit: Payer: Self-pay

## 2019-12-19 ENCOUNTER — Encounter: Payer: Self-pay | Admitting: Physical Therapy

## 2019-12-19 ENCOUNTER — Ambulatory Visit (INDEPENDENT_AMBULATORY_CARE_PROVIDER_SITE_OTHER): Payer: BC Managed Care – PPO | Admitting: Physical Therapy

## 2019-12-19 DIAGNOSIS — M25661 Stiffness of right knee, not elsewhere classified: Secondary | ICD-10-CM | POA: Diagnosis not present

## 2019-12-19 DIAGNOSIS — M25561 Pain in right knee: Secondary | ICD-10-CM

## 2019-12-19 DIAGNOSIS — R2689 Other abnormalities of gait and mobility: Secondary | ICD-10-CM

## 2019-12-19 NOTE — Therapy (Signed)
[facility] [address] [place], [place], [address] Phone: [phone]   Fax:  [phone]  Physical Therapy Treatment  Patient Details  Name: Brandon Franco MRN: [id number] Date of Birth: 1961-03-22 Referring Provider (PT): Kathrynn Speed   Encounter Date: 12/19/2019   PT End of Session - 12/19/19 0945    Visit Number 5    Number of Visits 12    Date for PT Re-Evaluation 01/14/20    Authorization Type BCBS    PT Start Time 0935    PT Stop Time 1015    PT Time Calculation (min) 40 min    Activity Tolerance Patient tolerated treatment well    Behavior During Therapy [facility] for tasks assessed/performed           Past Medical History:  Diagnosis Date  . Arthritis   . Chronic kidney disease    kidney stone  . Depression   . GERD (gastroesophageal reflux disease)   . History of kidney stones   . Hypertension   . IBS (irritable bowel syndrome)   . Prostate cancer [facility])    prostate  . Right bundle branch block     Past Surgical History:  Procedure Laterality Date  . APPENDECTOMY    . COLONOSCOPY    . HERNIA REPAIR    . PROSTATECTOMY  2016  . SHOULDER ARTHROSCOPY Right   . TOTAL HIP ARTHROPLASTY  04/26/2012   Procedure: TOTAL HIP ARTHROPLASTY ANTERIOR APPROACH;  Surgeon: Mcarthur Rossetti, MD;  Location: [facility];  Service: Orthopedics;  Laterality: Right;  Right Total Hip Arthroplasty  . TOTAL KNEE ARTHROPLASTY Right 11/14/2019   Procedure: RIGHT TOTAL KNEE ARTHROPLASTY;  Surgeon: Mcarthur Rossetti, MD;  Location: [facility];  Service: Orthopedics;  Laterality: Right;    There were no vitals filed for this visit.   Subjective Assessment - 12/19/19 0943    Subjective Pt states doing very well.    Currently in Pain? Yes    Pain Location Knee    Pain Orientation Right    Pain Descriptors / Indicators Aching    Pain Type Acute pain    Pain Onset More than a month ago    Pain Frequency Intermittent                              [facility] Adult PT Treatment/Exercise - 12/19/19 0945      Knee/Hip Exercises: Stretches   Quad Stretch --    Sports administrator Limitations --      Knee/Hip Exercises: Aerobic   Recumbent Bike L3 x 10 min       Knee/Hip Exercises: Standing   Heel Raises 20 reps    Hip Abduction 2 sets;10 reps    Abduction Limitations YTB    Lateral Step Up 15 reps;Both    Forward Step Up 15 reps;Both    SLS 30 sec x 2 bil;     Other Standing Knee Exercises Walk/March 10 ft x 4; fwd and bwd;         Knee/Hip Exercises: Seated   Long Arc Quad Right;20 reps    Long Arc Quad Limitations 3    Hamstring Curl --    Hamstring Limitations --      Knee/Hip Exercises: Supine   Quad Sets --    Straight Leg Raises Right;10 reps    Straight Leg Raises Limitations with slight ER x20      Knee/Hip Exercises: Sidelying  Hip ABduction 20 reps;Right      Manual Therapy   Manual Therapy Taping    Joint Mobilization to increase knee ext     Passive ROM PROM R knee for flex and Ext     Kinesiotex Create Space      Kinesiotix   Create Space 2 I strips, sides of incision for fascia mobility                   PT Education - 12/19/19 0944    Education Details Reviewed HEP    Person(s) Educated Patient    Methods Explanation;Demonstration;Verbal cues    Comprehension Verbalized understanding;Returned demonstration;Verbal cues required            PT Short Term Goals - 12/03/19 1228      PT SHORT TERM GOAL #1   Title Pt to be independent with initiL HEP    Time 2    Period Weeks    Status New    Target Date 12/17/19             PT Long Term Goals - 12/03/19 1229      PT LONG TERM GOAL #1   Title Pt to be independent with final HEP    Time 6    Period Weeks    Status New    Target Date 01/14/20      PT LONG TERM GOAL #2   Title Pt to demo improved ROM of R knee for Flex and Ext to be WNL    Time 6    Period Weeks    Status New    Target Date  01/14/20      PT LONG TERM GOAL #3   Title Pt to demo improved strength of R hip and knee, to be at least 4+/5 to improve stability and gait    Time 6    Period Weeks    Status New    Target Date 01/14/20      PT LONG TERM GOAL #4   Title Pt to demo improved gait mechanics for pt age, to be [facility], without AD, for community distances    Time 6    Period Weeks    Status New    Target Date 01/14/20      PT LONG TERM GOAL #5   Title Pt to demo independent/safe ability for stair negotiation, with recipricol patter, and without hand rail, for improved community activity    Time 6    Period Weeks    Status New    Target Date 01/14/20                 Plan - 12/19/19 1130    Clinical Impression Statement Continued ther ex progressions today for quad, hips and stability. Pt doing very well with ROM and strength, as well as increasing activity. Minimal pain.    Examination-Activity Limitations Locomotion Level;Sleep;Squat;Lift;Stand;Stairs    Examination-Participation Restrictions Meal Prep;Cleaning;Community Activity;Driving;Shop;Laundry;Yard Work    Stability/Clinical Decision Making Stable/Uncomplicated    Rehab Potential Good    PT Frequency 2x / week    PT Duration 6 weeks    PT Treatment/Interventions ADLs/Self Care Home Management;Cryotherapy;Electrical Stimulation;Gait training;DME Instruction;Ultrasound;Traction;Moist Heat;Iontophoresis 4mg /ml Dexamethasone;Stair training;Functional mobility training;Therapeutic activities;Therapeutic exercise;Balance training;Neuromuscular re-education;Manual techniques;Orthotic Fit/Training;Patient/family education;Passive range of motion;Dry needling;Taping;Joint Manipulations;Spinal Manipulations;Vestibular    Consulted and Agree with Plan of Care Patient           Patient will benefit from skilled therapeutic intervention in order to improve  the following deficits and impairments:  Abnormal gait, Decreased range of motion, Decreased  activity tolerance, Pain, Decreased balance, Impaired flexibility, Decreased strength, Decreased mobility  Visit Diagnosis: Acute pain of right knee  Stiffness of right knee, not elsewhere classified  Other abnormalities of gait and mobility     Problem List Patient Active Problem List   Diagnosis Date Noted  . Status post total right knee replacement 11/14/2019  . Primary osteoarthritis of right knee 10/01/2019  . Primary osteoarthritis of left knee 10/01/2019  . Unilateral primary osteoarthritis, left knee 12/17/2018  . Gastroesophageal reflux disease 12/07/2016  . Elevated LFTs 12/07/2016  . Acute pain of right knee 09/27/2016  . Chronic bilateral low back pain without sciatica 09/27/2016  . Degenerative arthritis of hip 04/26/2012    Lyndee Hensen, PT, DPT 11:31 AM  12/19/19    [facility] [place], [place], [address] Phone: [phone]   Fax:  [phone]  Name: Randal Goens MRN: [id number] Date of Birth: 24-Feb-1961

## 2019-12-22 ENCOUNTER — Ambulatory Visit (INDEPENDENT_AMBULATORY_CARE_PROVIDER_SITE_OTHER): Payer: BC Managed Care – PPO | Admitting: Physical Therapy

## 2019-12-22 ENCOUNTER — Encounter: Payer: Self-pay | Admitting: Physical Therapy

## 2019-12-22 ENCOUNTER — Other Ambulatory Visit: Payer: Self-pay

## 2019-12-22 DIAGNOSIS — R2689 Other abnormalities of gait and mobility: Secondary | ICD-10-CM

## 2019-12-22 DIAGNOSIS — M25561 Pain in right knee: Secondary | ICD-10-CM | POA: Diagnosis not present

## 2019-12-22 DIAGNOSIS — M25661 Stiffness of right knee, not elsewhere classified: Secondary | ICD-10-CM | POA: Diagnosis not present

## 2019-12-22 NOTE — Therapy (Signed)
[facility] [address] [place], [place], [address] Phone: [phone]   Fax:  [phone]  Physical Therapy Treatment  Patient Details  Name: Brandon Franco MRN: [id number] Date of Birth: 16-Oct-1960 Referring Provider (PT): Kathrynn Speed   Encounter Date: 12/22/2019   PT End of Session - 12/22/19 0901    Visit Number 6    Number of Visits 12    Date for PT Re-Evaluation 01/14/20    Authorization Type BCBS    PT Start Time 0850    PT Stop Time 0933    PT Time Calculation (min) 43 min    Activity Tolerance Patient tolerated treatment well    Behavior During Therapy [facility] for tasks assessed/performed           Past Medical History:  Diagnosis Date  . Arthritis   . Chronic kidney disease    kidney stone  . Depression   . GERD (gastroesophageal reflux disease)   . History of kidney stones   . Hypertension   . IBS (irritable bowel syndrome)   . Prostate cancer [facility])    prostate  . Right bundle branch block     Past Surgical History:  Procedure Laterality Date  . APPENDECTOMY    . COLONOSCOPY    . HERNIA REPAIR    . PROSTATECTOMY  2016  . SHOULDER ARTHROSCOPY Right   . TOTAL HIP ARTHROPLASTY  04/26/2012   Procedure: TOTAL HIP ARTHROPLASTY ANTERIOR APPROACH;  Surgeon: Mcarthur Rossetti, MD;  Location: [facility];  Service: Orthopedics;  Laterality: Right;  Right Total Hip Arthroplasty  . TOTAL KNEE ARTHROPLASTY Right 11/14/2019   Procedure: RIGHT TOTAL KNEE ARTHROPLASTY;  Surgeon: Mcarthur Rossetti, MD;  Location: [facility];  Service: Orthopedics;  Laterality: Right;    There were no vitals filed for this visit.   Subjective Assessment - 12/22/19 0857    Subjective Pt with no new complaints. Has been able to increase time on bike and walking at home.    Currently in Pain? Yes    Pain Score 1     Pain Location Knee    Pain Orientation Right;Left    Pain Descriptors / Indicators Aching    Pain Type Acute pain    Pain  Onset More than a month ago    Pain Frequency Intermittent                             [facility] Adult PT Treatment/Exercise - 12/22/19 0902      Knee/Hip Exercises: Stretches   Active Hamstring Stretch 3 reps;30 seconds    Active Hamstring Stretch Limitations seated      Knee/Hip Exercises: Aerobic   Recumbent Bike L3 x 10 min       Knee/Hip Exercises: Standing   Heel Raises --    Hip Abduction 2 sets;10 reps    Abduction Limitations YTB    Lateral Step Up 15 reps;Both    Forward Step Up 15 reps;Both    SLS 30 sec x 2 bil;     Other Standing Knee Exercises Walk/March 10 ft x 4; side stepping 20 ft x 2;       Other Standing Knee Exercises Air Ex mini squats x 20;       Knee/Hip Exercises: Seated   Long Arc Quad Right;20 reps    Long Arc Quad Limitations 4    Hamstring Curl 20 reps;Both    Hamstring Limitations Bl  TB      Knee/Hip Exercises: Supine   Straight Leg Raises Right;10 reps    Straight Leg Raises Limitations with slight ER x20      Knee/Hip Exercises: Sidelying   Hip ABduction 20 reps;Right      Manual Therapy   Manual Therapy Taping;Soft tissue mobilization    Joint Mobilization to increase knee ext     Soft tissue mobilization light scar massage     Passive ROM PROM R knee for flex and Ext     Kinesiotex --      Surveyor, quantity --                    PT Short Term Goals - 12/03/19 1228      PT SHORT TERM GOAL #1   Title Pt to be independent with initiL HEP    Time 2    Period Weeks    Status New    Target Date 12/17/19             PT Long Term Goals - 12/03/19 1229      PT LONG TERM GOAL #1   Title Pt to be independent with final HEP    Time 6    Period Weeks    Status New    Target Date 01/14/20      PT LONG TERM GOAL #2   Title Pt to demo improved ROM of R knee for Flex and Ext to be WNL    Time 6    Period Weeks    Status New    Target Date 01/14/20      PT LONG TERM GOAL #3   Title Pt to  demo improved strength of R hip and knee, to be at least 4+/5 to improve stability and gait    Time 6    Period Weeks    Status New    Target Date 01/14/20      PT LONG TERM GOAL #4   Title Pt to demo improved gait mechanics for pt age, to be [facility], without AD, for community distances    Time 6    Period Weeks    Status New    Target Date 01/14/20      PT LONG TERM GOAL #5   Title Pt to demo independent/safe ability for stair negotiation, with recipricol patter, and without hand rail, for improved community activity    Time 6    Period Weeks    Status New    Target Date 01/14/20                 Plan - 12/22/19 1038    Clinical Impression Statement Pt progressing very well, doing well with strength progressions. Seeing MD for follow up this week.    Examination-Activity Limitations Locomotion Level;Sleep;Squat;Lift;Stand;Stairs    Examination-Participation Restrictions Meal Prep;Cleaning;Community Activity;Driving;Shop;Laundry;Yard Work    Stability/Clinical Decision Making Stable/Uncomplicated    Rehab Potential Good    PT Frequency 2x / week    PT Duration 6 weeks    PT Treatment/Interventions ADLs/Self Care Home Management;Cryotherapy;Electrical Stimulation;Gait training;DME Instruction;Ultrasound;Traction;Moist Heat;Iontophoresis 4mg /ml Dexamethasone;Stair training;Functional mobility training;Therapeutic activities;Therapeutic exercise;Balance training;Neuromuscular re-education;Manual techniques;Orthotic Fit/Training;Patient/family education;Passive range of motion;Dry needling;Taping;Joint Manipulations;Spinal Manipulations;Vestibular    Consulted and Agree with Plan of Care Patient           Patient will benefit from skilled therapeutic intervention in order to improve the following deficits and impairments:  Abnormal gait, Decreased range of motion,  Decreased activity tolerance, Pain, Decreased balance, Impaired flexibility, Decreased strength, Decreased  mobility  Visit Diagnosis: Acute pain of right knee  Stiffness of right knee, not elsewhere classified  Other abnormalities of gait and mobility     Problem List Patient Active Problem List   Diagnosis Date Noted  . Status post total right knee replacement 11/14/2019  . Primary osteoarthritis of right knee 10/01/2019  . Primary osteoarthritis of left knee 10/01/2019  . Unilateral primary osteoarthritis, left knee 12/17/2018  . Gastroesophageal reflux disease 12/07/2016  . Elevated LFTs 12/07/2016  . Acute pain of right knee 09/27/2016  . Chronic bilateral low back pain without sciatica 09/27/2016  . Degenerative arthritis of hip 04/26/2012    Lyndee Hensen, PT, DPT 10:42 AM  12/22/19   [facility] [place], [place], [address] Phone: [phone]   Fax:  [phone]  Name: Brandon Franco MRN: [id number] Date of Birth: 02-18-1961

## 2019-12-24 ENCOUNTER — Encounter: Payer: Self-pay | Admitting: Physical Therapy

## 2019-12-24 ENCOUNTER — Other Ambulatory Visit: Payer: Self-pay

## 2019-12-24 ENCOUNTER — Ambulatory Visit (INDEPENDENT_AMBULATORY_CARE_PROVIDER_SITE_OTHER): Payer: BC Managed Care – PPO | Admitting: Physical Therapy

## 2019-12-24 DIAGNOSIS — M25561 Pain in right knee: Secondary | ICD-10-CM

## 2019-12-24 DIAGNOSIS — M25661 Stiffness of right knee, not elsewhere classified: Secondary | ICD-10-CM | POA: Diagnosis not present

## 2019-12-24 DIAGNOSIS — R2689 Other abnormalities of gait and mobility: Secondary | ICD-10-CM | POA: Diagnosis not present

## 2019-12-24 NOTE — Therapy (Signed)
[facility] [address] [place], [place], [address] Phone: [phone]   Fax:  [phone]  Physical Therapy Treatment  Patient Details  Name: Brandon Franco MRN: [id number] Date of Birth: 1960/06/24 Referring Provider (PT): Kathrynn Speed   Encounter Date: 12/24/2019   PT End of Session - 12/24/19 0901    Visit Number 7    Number of Visits 12    Date for PT Re-Evaluation 01/14/20    Authorization Type BCBS    PT Start Time 0850    PT Stop Time 0930    PT Time Calculation (min) 40 min    Activity Tolerance Patient tolerated treatment well    Behavior During Therapy [facility] for tasks assessed/performed           Past Medical History:  Diagnosis Date  . Arthritis   . Chronic kidney disease    kidney stone  . Depression   . GERD (gastroesophageal reflux disease)   . History of kidney stones   . Hypertension   . IBS (irritable bowel syndrome)   . Prostate cancer [facility])    prostate  . Right bundle branch block     Past Surgical History:  Procedure Laterality Date  . APPENDECTOMY    . COLONOSCOPY    . HERNIA REPAIR    . PROSTATECTOMY  2016  . SHOULDER ARTHROSCOPY Right   . TOTAL HIP ARTHROPLASTY  04/26/2012   Procedure: TOTAL HIP ARTHROPLASTY ANTERIOR APPROACH;  Surgeon: Mcarthur Rossetti, MD;  [facility]: [facility];  Service: Orthopedics;  Laterality: Right;  Right Total Hip Arthroplasty  . TOTAL KNEE ARTHROPLASTY Right 11/14/2019   Procedure: RIGHT TOTAL KNEE ARTHROPLASTY;  Surgeon: Mcarthur Rossetti, MD;  Location: [facility];  Service: Orthopedics;  Laterality: Right;    There were no vitals filed for this visit.   Subjective Assessment - 12/24/19 0901    Subjective Pt has been increasing walking and biking at home. Reports doing very well.    Currently in Pain? Yes    Pain Location Knee    Pain Orientation Right    Pain Descriptors / Indicators Aching    Pain Type Acute pain    Pain Onset More than a month ago     Pain Frequency Intermittent                             [facility] Adult PT Treatment/Exercise - 12/24/19 0902      Knee/Hip Exercises: Stretches   Active Hamstring Stretch 3 reps;30 seconds    Active Hamstring Stretch Limitations seated      Knee/Hip Exercises: Aerobic   Recumbent Bike L3 x 10 min       Knee/Hip Exercises: Standing   Hip Abduction 2 sets;10 reps    Abduction Limitations YTB    Lateral Step Up 15 reps;Both    Forward Step Up 15 reps;Both    SLS 30 sec x 2 bil;     Other Standing Knee Exercises Walk/March 10 ft x 4; side stepping 20 ft x 2;       Other Standing Knee Exercises --      Knee/Hip Exercises: Seated   Long Arc Quad Right;20 reps    Long Arc Quad Limitations 4    Hamstring Curl 20 reps;Both    Hamstring Limitations Bl TB      Knee/Hip Exercises: Supine   Bridges 20 reps    Bridges Limitations with ball sq  Straight Leg Raises Right;10 reps    Straight Leg Raises Limitations with slight ER x20 1.5 lb       Knee/Hip Exercises: Sidelying   Hip ABduction --      Manual Therapy   Manual Therapy --    Joint Mobilization --    Soft tissue mobilization --    Passive ROM PROM R knee for flex and Ext                     PT Short Term Goals - 12/03/19 1228      PT SHORT TERM GOAL #1   Title Pt to be independent with initiL HEP    Time 2    Period Weeks    Status New    Target Date 12/17/19             PT Long Term Goals - 12/03/19 1229      PT LONG TERM GOAL #1   Title Pt to be independent with final HEP    Time 6    Period Weeks    Status New    Target Date 01/14/20      PT LONG TERM GOAL #2   Title Pt to demo improved ROM of R knee for Flex and Ext to be WNL    Time 6    Period Weeks    Status New    Target Date 01/14/20      PT LONG TERM GOAL #3   Title Pt to demo improved strength of R hip and knee, to be at least 4+/5 to improve stability and gait    Time 6    Period Weeks    Status New     Target Date 01/14/20      PT LONG TERM GOAL #4   Title Pt to demo improved gait mechanics for pt age, to be WFL, without AD, for community distances    Time 6    Period Weeks    Status New    Target Date 01/14/20      PT LONG TERM GOAL #5   Title Pt to demo independent/safe ability for stair negotiation, with recipricol patter, and without hand rail, for improved community activity    Time 6    Period Weeks    Status New    Target Date 01/14/20                 Plan - 12/24/19 1038    Clinical Impression Statement Pt improving with strengthening , has been able to increase reps and weight. Also improving with stability, seen with SLS phase of walking, marching, stairs, and balance. Pt progressing very well, has MD f/u this week.    Examination-Activity Limitations Locomotion Level;Sleep;Squat;Lift;Stand;Stairs    Examination-Participation Restrictions Meal Prep;Cleaning;Community Activity;Driving;Shop;Laundry;Yard Work    Stability/Clinical Decision Making Stable/Uncomplicated    Rehab Potential Good    PT Frequency 2x / week    PT Duration 6 weeks    PT Treatment/Interventions ADLs/Self Care Home Management;Cryotherapy;Electrical Stimulation;Gait training;DME Instruction;Ultrasound;Traction;Moist Heat;Iontophoresis 4mg /ml Dexamethasone;Stair training;Functional mobility training;Therapeutic activities;Therapeutic exercise;Balance training;Neuromuscular re-education;Manual techniques;Orthotic Fit/Training;Patient/family education;Passive range of motion;Dry needling;Taping;Joint Manipulations;Spinal Manipulations;Vestibular    Consulted and Agree with Plan of Care Patient           Patient will benefit from skilled therapeutic intervention in order to improve the following deficits and impairments:  Abnormal gait, Decreased range of motion, Decreased activity tolerance, Pain, Decreased balance, Impaired flexibility, Decreased strength, Decreased mobility  Visit  Diagnosis: Acute pain of right knee  Stiffness of right knee, not elsewhere classified  Other abnormalities of gait and mobility     Problem List Patient Active Problem List   Diagnosis Date Noted  . Status post total right knee replacement 11/14/2019  . Primary osteoarthritis of right knee 10/01/2019  . Primary osteoarthritis of left knee 10/01/2019  . Unilateral primary osteoarthritis, left knee 12/17/2018  . Gastroesophageal reflux disease 12/07/2016  . Elevated LFTs 12/07/2016  . Acute pain of right knee 09/27/2016  . Chronic bilateral low back pain without sciatica 09/27/2016  . Degenerative arthritis of hip 04/26/2012    Lyndee Hensen, PT, DPT 10:39 AM  12/24/19    [facility] [place], [place], [address] Phone: [phone]   Fax:  [phone]  Name: Brandon Franco MRN: [id number] Date of Birth: February 10, 1961

## 2019-12-25 ENCOUNTER — Encounter: Payer: Self-pay | Admitting: Physician Assistant

## 2019-12-25 ENCOUNTER — Ambulatory Visit (INDEPENDENT_AMBULATORY_CARE_PROVIDER_SITE_OTHER): Payer: BC Managed Care – PPO | Admitting: Physician Assistant

## 2019-12-25 DIAGNOSIS — Z96651 Presence of right artificial knee joint: Secondary | ICD-10-CM

## 2019-12-25 NOTE — Progress Notes (Signed)
HPI: [name] returns today 6 weeks status post right total knee arthroplasty.  He is overall doing well no problems he states kneel but this feels all but no pain.  He has been going to physical therapy 3 times a week.  He is taking no pain medications.  He is back to biking and walking for exercise.  He is using no assistive device.   Physical exam: Right knee full extension flexion to 120 degrees.  Knee appears slightly valgus compared to his left negative knee.  Valgus varus stressing no significant laxity.  Surgical incisions healing well no signs of action.  Slight keloid formation.  Right calf supple nontender.  Impression: Status post right total knee arthroplasty  Plan: He will continue work on scar tissue mobilization.  Also work on strengthening the knee.  Follow-up with [place] in 4-1/2 months sooner if there is any questions concerns.  Obtain an AP lateral view of the right knee at that time.  Questions were encouraged and answered by Dr. Ninfa Linden and myself today.

## 2020-01-06 ENCOUNTER — Other Ambulatory Visit: Payer: Self-pay

## 2020-01-06 ENCOUNTER — Encounter: Payer: Self-pay | Admitting: Physical Therapy

## 2020-01-06 ENCOUNTER — Ambulatory Visit (INDEPENDENT_AMBULATORY_CARE_PROVIDER_SITE_OTHER): Payer: BC Managed Care – PPO | Admitting: Physical Therapy

## 2020-01-06 DIAGNOSIS — R2689 Other abnormalities of gait and mobility: Secondary | ICD-10-CM

## 2020-01-06 DIAGNOSIS — M25561 Pain in right knee: Secondary | ICD-10-CM | POA: Diagnosis not present

## 2020-01-06 DIAGNOSIS — M25661 Stiffness of right knee, not elsewhere classified: Secondary | ICD-10-CM

## 2020-01-06 NOTE — Patient Instructions (Signed)
Access Code: [email or web address] URL: [email or web address] Date: 01/06/2020 Prepared by: Lyndee Hensen  Exercises Supine Bridge - 1 x daily - 2 sets - 10 reps Straight Leg Raise - 2 x daily - 2 sets - 10 reps Sidelying Hip Abduction - 2 x daily - 2 sets - 10 reps Seated Knee Extension AROM - 2 x daily - 2 sets - 10 reps Seated Hamstring Curl with Anchored Resistance - 1 x daily - 2 sets - 10 reps Standing Repeated Hip Abduction with Resistance - 1 x daily - 2 sets - 10 reps Step Up - 1 x daily - 1-2 sets - 10 reps Lateral Step Up - 1 x daily - 1-2 sets - 10 reps Standing March - 1 x daily - 2 sets - 10 reps Single Leg Stance - 1 x daily - 3 reps - 30 hold

## 2020-01-06 NOTE — Therapy (Signed)
[facility] [address] [place], [place], [address] Phone: [phone]   Fax:  [phone]  Physical Therapy Treatment/Discharge  Patient Details  Name: Brandon Franco MRN: [id number] Date of Birth: [birth year] Referring Provider (PT): Kathrynn Speed   Encounter Date: 01/06/2020   PT End of Session - 01/06/20 1441    Visit Number 8    Number of Visits 12    Date for PT Re-Evaluation 01/14/20    Authorization Type BCBS    PT Start Time 1435    PT Stop Time 1515    PT Time Calculation (min) 40 min    Activity Tolerance Patient tolerated treatment well    Behavior During Therapy [facility] for tasks assessed/performed           Past Medical History:  Diagnosis Date  . Arthritis   . Chronic kidney disease    kidney stone  . Depression   . GERD (gastroesophageal reflux disease)   . History of kidney stones   . Hypertension   . IBS (irritable bowel syndrome)   . Prostate cancer [facility])    prostate  . Right bundle branch block     Past Surgical History:  Procedure Laterality Date  . APPENDECTOMY    . COLONOSCOPY    . HERNIA REPAIR    . PROSTATECTOMY  2016  . SHOULDER ARTHROSCOPY Right   . TOTAL HIP ARTHROPLASTY  04/26/2012   Procedure: TOTAL HIP ARTHROPLASTY ANTERIOR APPROACH;  Surgeon: Mcarthur Rossetti, MD;  Location: [facility];  Service: Orthopedics;  Laterality: Right;  Right Total Hip Arthroplasty  . TOTAL KNEE ARTHROPLASTY Right 11/14/2019   Procedure: RIGHT TOTAL KNEE ARTHROPLASTY;  Surgeon: Mcarthur Rossetti, MD;  Location: [facility];  Service: Orthopedics;  Laterality: Right;    There were no vitals filed for this visit.   Subjective Assessment - 01/06/20 1439    Subjective Pt with no new complaints. Doing very well, minimal pain only when sleeping. Saw MD with good report last week.    Currently in Pain? No/denies    Pain Score 0-No pain              [facility] PT Assessment - 01/06/20 0001      AROM   Right Knee  Extension 0    Right Knee Flexion 135      Strength   Right Knee Flexion 5/5    Left Knee Flexion 5/5                         [facility] Adult PT Treatment/Exercise - 01/06/20 1443      Knee/Hip Exercises: Stretches   Active Hamstring Stretch 3 reps;30 seconds    Active Hamstring Stretch Limitations seated      Knee/Hip Exercises: Aerobic   Recumbent Bike L3 x 8 min       Knee/Hip Exercises: Standing   Heel Raises 20 reps    Hip Flexion 20 reps;Knee bent    Hip Abduction 2 sets;10 reps    Abduction Limitations YTB    Lateral Step Up 15 reps;Both    Forward Step Up 15 reps;Both    SLS 30 sec x 2 bil;     Other Standing Knee Exercises Walk/March 10 ft x 4;  quick side stepping 20 ft x 6;         Knee/Hip Exercises: Seated   Long [employer] reps    Long [employer] Limitations 4  Hamstring Curl 20 reps;Both    Hamstring Limitations Bl TB      Knee/Hip Exercises: Supine   Bridges --    Bridges Limitations --    Straight Leg Raises Right;20 reps    Straight Leg Raises Limitations with slight ER x20 1.5 lb       Knee/Hip Exercises: Sidelying   Hip ABduction 20 reps;Right;Left      Manual Therapy   Passive ROM --                  PT Education - 01/06/20 1441    Education Details Reviewed HEP    Person(s) Educated Patient    Methods Explanation;Demonstration;Verbal cues;Handout    Comprehension Verbalized understanding;Returned demonstration;Verbal cues required            PT Short Term Goals - 01/06/20 1442      PT SHORT TERM GOAL #1   Title Pt to be independent with initiL HEP    Time 2    Period Weeks    Status Achieved    Target Date 12/17/19             PT Long Term Goals - 01/06/20 1442      PT LONG TERM GOAL #1   Title Pt to be independent with final HEP    Time 6    Period Weeks    Status Achieved      PT LONG TERM GOAL #2   Title Pt to demo improved ROM of R knee for Flex and Ext to be WNL    Time 6    Period  Weeks    Status Achieved      PT LONG TERM GOAL #3   Title Pt to demo improved strength of R hip and knee, to be at least 4+/5 to improve stability and gait    Time 6    Period Weeks    Status Achieved      PT LONG TERM GOAL #4   Title Pt to demo improved gait mechanics for pt age, to be WFL, without AD, for community distances    Time 6    Period Weeks    Status Achieved      PT LONG TERM GOAL #5   Title Pt to demo independent/safe ability for stair negotiation, with recipricol patter, and without hand rail, for improved community activity    Time 6    Period Weeks    Status Achieved                 Plan - 01/06/20 1551    Clinical Impression Statement Pt doing very well, has made good progress wtih strength and stability. Pt has met goals at this time, and is ready for d/c to HEP. Final HEP reviewed today. Pt with no remaining functional deficits requiring skilled care. Discussed slow progression back to higher level activities.    Examination-Activity Limitations Locomotion Level;Sleep;Squat;Lift;Stand;Stairs    Examination-Participation Restrictions Meal Prep;Cleaning;Community Activity;Driving;Shop;Laundry;Yard Work    Stability/Clinical Decision Making Stable/Uncomplicated    Rehab Potential Good    PT Frequency 2x / week    PT Duration 6 weeks    PT Treatment/Interventions ADLs/Self Care Home Management;Cryotherapy;Electrical Stimulation;Gait training;DME Instruction;Ultrasound;Traction;Moist Heat;Iontophoresis 37m/ml Dexamethasone;Stair training;Functional mobility training;Therapeutic activities;Therapeutic exercise;Balance training;Neuromuscular re-education;Manual techniques;Orthotic Fit/Training;Patient/family education;Passive range of motion;Dry needling;Taping;Joint Manipulations;Spinal Manipulations;Vestibular    Consulted and Agree with Plan of Care Patient           Patient will benefit from skilled therapeutic intervention  in order to improve the  following deficits and impairments:  Abnormal gait, Decreased range of motion, Decreased activity tolerance, Pain, Decreased balance, Impaired flexibility, Decreased strength, Decreased mobility  Visit Diagnosis: Acute pain of right knee  Stiffness of right knee, not elsewhere classified  Other abnormalities of gait and mobility     Problem List Patient Active Problem List   Diagnosis Date Noted  . Status post total right knee replacement 11/14/2019  . Primary osteoarthritis of right knee 10/01/2019  . Primary osteoarthritis of left knee 10/01/2019  . Unilateral primary osteoarthritis, left knee 12/17/2018  . Gastroesophageal reflux disease 12/07/2016  . Elevated LFTs 12/07/2016  . Acute pain of right knee 09/27/2016  . Chronic bilateral low back pain without sciatica 09/27/2016  . Degenerative arthritis of hip 04/26/2012    Lyndee Hensen, PT, DPT 3:54 PM  01/06/20    [facility] [place], [place], [address] Phone: [phone]   Fax:  [phone]  Name: Brandon Franco MRN: [id number] Date of Birth: 1961-03-14   PHYSICAL THERAPY DISCHARGE SUMMARY  Visits from Start of Care: 8  Plan: Patient agrees to discharge.  Patient goals were met. Patient is being discharged due to meeting the stated rehab goals.  ?????     Lyndee Hensen, PT, DPT 3:54 PM  01/06/20

## 2020-01-13 ENCOUNTER — Encounter: Payer: BC Managed Care – PPO | Admitting: Physical Therapy

## 2020-01-20 ENCOUNTER — Encounter: Payer: BC Managed Care – PPO | Admitting: Physical Therapy

## 2020-03-01 ENCOUNTER — Ambulatory Visit: Payer: BC Managed Care – PPO | Attending: Internal Medicine

## 2020-03-01 ENCOUNTER — Other Ambulatory Visit (HOSPITAL_BASED_OUTPATIENT_CLINIC_OR_DEPARTMENT_OTHER): Payer: Self-pay | Admitting: Internal Medicine

## 2020-03-01 DIAGNOSIS — Z23 Encounter for immunization: Secondary | ICD-10-CM

## 2020-03-01 MED FILL — MODERNA COVID-19 VACCINE 10: 100 | 1 days supply | Qty: 0 | Fill #0

## 2020-03-01 NOTE — Progress Notes (Signed)
   Covid-19 Vaccination Clinic  Name:  Seven Dollens    MRN: [id number] DOB: Jan 29, 1961  03/01/2020  Mr. Minchey was observed post Covid-19 immunization for 15 minutes without incident. He was provided with Vaccine Information Sheet and instruction to access the V-Safe system.   Mr. Carchi was instructed to call 911 with any severe reactions post vaccine: Marland Kitchen Difficulty breathing  . Swelling of face and throat  . A fast heartbeat  . A bad rash all over body  . Dizziness and weakness   Immunizations Administered    No immunizations on file.     Vaccine given by Beckey Rutter, pharmacy student

## 2020-03-17 ENCOUNTER — Telehealth: Payer: Self-pay | Admitting: Internal Medicine

## 2020-03-18 MED ORDER — OMEPRAZOLE 20 MG PO CPDR: DELAYED_RELEASE_CAPSULE | ORAL | 3 refills | Status: DC

## 2020-03-18 NOTE — Telephone Encounter (Signed)
Omeprazole refilled.

## 2020-04-07 ENCOUNTER — Encounter: Payer: Self-pay | Admitting: Orthopaedic Surgery

## 2020-04-07 ENCOUNTER — Ambulatory Visit (INDEPENDENT_AMBULATORY_CARE_PROVIDER_SITE_OTHER): Payer: BC Managed Care – PPO | Admitting: Orthopaedic Surgery

## 2020-04-07 ENCOUNTER — Ambulatory Visit: Payer: Self-pay

## 2020-04-07 DIAGNOSIS — Z96651 Presence of right artificial knee joint: Secondary | ICD-10-CM | POA: Diagnosis not present

## 2020-04-07 NOTE — Progress Notes (Signed)
Brandon Franco is a 59 year old gentleman who is just over 4 and half months status post a right total knee arthroplasty. He is back to playing tennis but does feel that the knee feels strange at times. He does report good motion and strength.  Examination of his right knee shows minimal swelling. He has excellent range of motion of that knee. There is a little bit of play with varus and valgus stressing of the knee.  2 views of the right knee show well-seated total knee arthroplasty that is press-fit. There is no evidence of loosening or complicating features.  I want him to concentrate on quad strengthening exercises I would like to see him back in 3 months for repeat exam but no x-rays are needed. All questions and concerns were answered and addressed.

## 2020-04-26 ENCOUNTER — Ambulatory Visit: Payer: BC Managed Care – PPO | Admitting: Orthopaedic Surgery

## 2020-04-27 ENCOUNTER — Telehealth: Payer: Self-pay | Admitting: Internal Medicine

## 2020-04-27 NOTE — Telephone Encounter (Signed)
Forwarding to Dr. Henrene Pastor

## 2020-04-27 NOTE — Telephone Encounter (Signed)
Pt is scheduled for ov with Dr. Henrene Pastor this Friday 04/30/20. He called to inform that he was exposed to Covid last Saturday. However, he is ot having any sxs. He would like to know if he can still keep his appt or if he needs to r/s.

## 2020-04-27 NOTE — Telephone Encounter (Signed)
As long as he has had no additional exposure since that time and is feeling perfectly well,, and remains well, it is okay.

## 2020-04-27 NOTE — Telephone Encounter (Signed)
Please see note below and advise  

## 2020-04-27 NOTE — Telephone Encounter (Signed)
Spoke with pt and he is aware of Dr. Perry's recommendations. 

## 2020-04-28 DIAGNOSIS — C4491 Basal cell carcinoma of skin, unspecified: Secondary | ICD-10-CM

## 2020-04-28 HISTORY — DX: Basal cell carcinoma of skin, unspecified: C44.91

## 2020-04-28 HISTORY — PX: MOHS SURGERY: SUR867

## 2020-04-30 ENCOUNTER — Encounter: Payer: Self-pay | Admitting: Internal Medicine

## 2020-04-30 ENCOUNTER — Ambulatory Visit (INDEPENDENT_AMBULATORY_CARE_PROVIDER_SITE_OTHER): Payer: 59 | Admitting: Internal Medicine

## 2020-04-30 VITALS — BP 130/70 | HR 79 | Ht 74.0 in | Wt 265.0 lb

## 2020-04-30 DIAGNOSIS — K219 Gastro-esophageal reflux disease without esophagitis: Secondary | ICD-10-CM

## 2020-04-30 MED ORDER — OMEPRAZOLE 20 MG PO CPDR: DELAYED_RELEASE_CAPSULE | ORAL | 3 refills | Status: DC

## 2020-04-30 NOTE — Patient Instructions (Signed)
If you are age 59 or older, your body mass index should be between 23-30. Your Body mass index is 34.02 kg/m. If this is out of the aforementioned range listed, please consider follow up with your Primary Care Provider.  If you are age 39 or younger, your body mass index should be between 19-25. Your Body mass index is 34.02 kg/m. If this is out of the aformentioned range listed, please consider follow up with your Primary Care Provider.   We have sent the following medications to your pharmacy for you to pick up at your convenience:  Omeprazole  Please follow up in 1-2 years

## 2020-04-30 NOTE — Progress Notes (Signed)
HISTORY OF PRESENT ILLNESS:  Brandon Franco is a 60 y.o. male, retired [employer] officer, political party currently enjoying woodwork, gardening, and tennis.  He presents today for routine follow-up regarding management of chronic GERD.  He was last seen in this office January 09, 2019.  At that time he was doing well on once daily PPI in the form of omeprazole 20 mg daily.  Previous upper endoscopy in 2017 was normal.  He tells me that as long as he is compliant with medication, he has no symptoms.  No dysphagia.  No appreciable medication side effects.  He does inquire about potential long-term side effects of PPI therapy.  No lower GI complaints.  He did undergo screening colonoscopy in [place] and here November 2013.  No neoplasia.  Most recent exam did reveal mild sigmoid diverticulosis and an incidental cecal AVM.  Review of blood work from November 15, 2019 shows unremarkable CBC with hemoglobin 13.5.  REVIEW OF SYSTEMS:  All non-GI ROS negative unless otherwise stated in the HPI except for arthritis (knees)  Past Medical History:  Diagnosis Date   Arthritis    Basal cell carcinoma 04/28/2020   right ear   Chronic kidney disease    kidney stone   Depression    GERD (gastroesophageal reflux disease)    History of kidney stones    Hypertension    IBS (irritable bowel syndrome)    Prostate cancer ([place])    prostate   Right bundle branch block     Past Surgical History:  Procedure Laterality Date   APPENDECTOMY     COLONOSCOPY     HERNIA REPAIR     MOHS SURGERY Right 04/28/2020   right ear   PROSTATECTOMY  2016   SHOULDER ARTHROSCOPY Right    TOTAL HIP ARTHROPLASTY  04/26/2012   Procedure: TOTAL HIP ARTHROPLASTY ANTERIOR APPROACH;  Surgeon: Mcarthur Rossetti, MD;  [facility];  Service: Orthopedics;  Laterality: Right;  Right Total Hip Arthroplasty   TOTAL KNEE ARTHROPLASTY Right 11/14/2019   Procedure: RIGHT TOTAL KNEE ARTHROPLASTY;  Surgeon: Mcarthur Rossetti,  MD;  Location: [facility];  Service: Orthopedics;  Laterality: Right;    Social History Brandon Franco  reports that he has never smoked. He has never used smokeless tobacco. He reports current alcohol use of about 1.0 standard drink of alcohol per week. He reports that he does not use drugs.  family history includes Colon cancer in his paternal uncle; Heart disease in his mother; Hypertension in his father.  Allergies  Allergen Reactions   Lisinopril Cough       PHYSICAL EXAMINATION: Vital signs: BP 130/70    Pulse 79    Ht 6\' 2"  (1.88 m)    Wt 265 lb (120.2 kg)    BMI 34.02 kg/m   Constitutional: generally well-appearing, no acute distress Psychiatric: alert and oriented x3, cooperative Eyes: extraocular movements intact, anicteric, conjunctiva pink Mouth: oral pharynx moist, no lesions Neck: supple no lymphadenopathy Cardiovascular: heart regular rate and rhythm, no murmur Lungs: clear to auscultation bilaterally Abdomen: soft, nontender, nondistended, no obvious ascites, no peritoneal signs, normal bowel sounds, no organomegaly Rectal: Omitted Extremities: no clubbing, cyanosis, or lower extremity edema bilaterally Skin: no lesions on visible extremities Neuro: No focal deficits.  Cranial nerves intact  ASSESSMENT:  1.  Chronic GERD.  Normal EGD 2017.  Symptoms well controlled with low-dose PPI. 2.  Negative screening colonoscopy 2004 and 2013   PLAN:  1.  Reflux precautions 2.  Refill omeprazole  20 mg daily.  90-day supply.  1 year refills.  Medication risks reviewed detail 3.  Routine surveillance colonoscopy around November 2023 4.  Routine GI office follow-up 1 to 2 years.  Contact the office in the interim for any questions or problems

## 2020-06-28 ENCOUNTER — Encounter: Payer: Self-pay | Admitting: Orthopaedic Surgery

## 2020-07-06 ENCOUNTER — Ambulatory Visit: Payer: BC Managed Care – PPO | Admitting: Orthopaedic Surgery

## 2020-09-10 ENCOUNTER — Other Ambulatory Visit (HOSPITAL_COMMUNITY): Payer: Self-pay | Admitting: Orthopedic Surgery

## 2020-09-10 ENCOUNTER — Other Ambulatory Visit: Payer: Self-pay | Admitting: Orthopedic Surgery

## 2020-09-10 DIAGNOSIS — Z96651 Presence of right artificial knee joint: Secondary | ICD-10-CM

## 2020-09-16 ENCOUNTER — Ambulatory Visit (HOSPITAL_COMMUNITY)
Admission: RE | Admit: 2020-09-16 | Discharge: 2020-09-16 | Disposition: A | Discharge status: Home / Self Care | Payer: 59 | Source: Ambulatory Visit | Attending: Orthopedic Surgery | Admitting: Orthopedic Surgery

## 2020-09-16 ENCOUNTER — Encounter (HOSPITAL_COMMUNITY)
Admission: RE | Admit: 2020-09-16 | Discharge: 2020-09-16 | Disposition: A | Discharge status: Home / Self Care | Payer: 59 | Source: Ambulatory Visit | Attending: Orthopedic Surgery | Admitting: Orthopedic Surgery

## 2020-09-16 ENCOUNTER — Other Ambulatory Visit: Payer: Self-pay

## 2020-09-16 DIAGNOSIS — Z96651 Presence of right artificial knee joint: Secondary | ICD-10-CM

## 2020-09-16 MED ORDER — TECHNETIUM TC 99M MEDRONATE IV KIT
21.7000 | PACK | Freq: Once | INTRAVENOUS | Status: AC | PRN
  Administered 2020-09-16: 21.7 via INTRAVENOUS

## 2020-11-16 NOTE — Patient Instructions (Addendum)
DUE TO COVID-19 ONLY ONE VISITOR IS ALLOWED TO COME WITH YOU AND STAY IN THE WAITING ROOM ONLY DURING PRE OP AND PROCEDURE DAY OF SURGERY. THE 2 VISITORS  MAY VISIT WITH YOU AFTER SURGERY IN YOUR PRIVATE ROOM DURING VISITING HOURS ONLY!  YOU NEED TO HAVE A COVID 19 TEST ON__8/22_____ '@_______'$ , THIS TEST MUST BE DONE BEFORE SURGERY,                 Brandon Franco    Your procedure is scheduled on: 12/01/20   Report to [facility]  Entrance   Report to admitting at   8:20 AM     Call this number if you have problems the morning of surgery [place], NO [place].  No food after midnight.    You may have clear liquid until 7:30 AM  .  At 7:00 AM drink pre surgery drink.   Nothing by mouth after 7:30 AM.     Take these medicines the morning of surgery with A SIP OF WATER: Omeprazole    Bring mask and tubing                               You may not have any metal on your body including              piercings  Do not wear jewelry,  lotions, powders or deodorant                        Men may shave face and neck.   Do not bring valuables to the hospital. [place].  Contacts, dentures or bridgework may not be worn into surgery.                    Please read over the following fact sheets you were given: _____________________________________________________________________             [facility] - Preparing for Surgery Before surgery, you can play an important role.  Because skin is not sterile, your skin needs to be as free of germs as possible.  You can reduce the number of germs on your skin by washing with CHG (chlorahexidine gluconate) soap before surgery.  CHG is an antiseptic cleaner which kills germs and bonds with the skin to continue killing germs even after washing. Please DO NOT use if you have an allergy to CHG or  antibacterial soaps.  If your skin becomes reddened/irritated stop using the CHG and inform your nurse when you arrive at Short Stay.  You may shave your face/neck.  Please follow these instructions carefully:  1.  Shower with CHG Soap the night before surgery and the  morning of Surgery.  2.  If you choose to wash your hair, wash your hair first as usual with your  normal  shampoo.  3.  After you shampoo, rinse your hair and body thoroughly to remove the  shampoo.                                        4.  Use CHG as you  would any other liquid soap.  You can apply chg directly  to the skin and wash                       Gently with a scrungie or clean washcloth.  5.  Apply the CHG Soap to your body ONLY FROM THE NECK DOWN.   Do not use on face/ open                           Wound or open sores. Avoid contact with eyes, ears mouth and genitals (private parts).                       Wash face,  Genitals (private parts) with your normal soap.             6.  Wash thoroughly, paying special attention to the area where your surgery  will be performed.  7.  Thoroughly rinse your body with warm water from the neck down.  8.  DO NOT shower/wash with your normal soap after using and rinsing off  the CHG Soap.             9.  Pat yourself dry with a clean towel.            10.  Wear clean pajamas.            11.  Place clean sheets on your bed the night of your first shower and do not  sleep with pets. Day of Surgery : Do not apply any lotions/deodorants the morning of surgery.  Please wear clean clothes to the hospital/surgery center.  FAILURE TO FOLLOW THESE INSTRUCTIONS MAY RESULT IN THE CANCELLATION OF YOUR SURGERY PATIENT SIGNATURE_________________________________  NURSE SIGNATURE__________________________________  ________________________________________________________________________   Adam Phenix  An incentive spirometer is a tool that can help keep your lungs clear and  active. This tool measures how well you are filling your lungs with each breath. Taking long deep breaths may help reverse or decrease the chance of developing breathing (pulmonary) problems (especially infection) following: A long period of time when you are unable to move or be active. BEFORE THE PROCEDURE  If the spirometer includes an indicator to show your best effort, your nurse or respiratory therapist will set it to a desired goal. If possible, sit up straight or lean slightly forward. Try not to slouch. Hold the incentive spirometer in an upright position. INSTRUCTIONS FOR USE  Sit on the edge of your bed if possible, or sit up as far as you can in bed or on a chair. Hold the incentive spirometer in an upright position. Breathe out normally. Place the mouthpiece in your mouth and seal your lips tightly around it. Breathe in slowly and as deeply as possible, raising the piston or the ball toward the top of the column. Hold your breath for 3-5 seconds or for as long as possible. Allow the piston or ball to fall to the bottom of the column. Remove the mouthpiece from your mouth and breathe out normally. Rest for a few seconds and repeat Steps 1 through 7 at least 10 times every 1-2 hours when you are awake. Take your time and take a few normal breaths between deep breaths. The spirometer may include an indicator to show your best effort. Use the indicator as a goal to work toward during each repetition. After each set of 10 deep breaths,  practice coughing to be sure your lungs are clear. If you have an incision (the cut made at the time of surgery), support your incision when coughing by placing a pillow or rolled up towels firmly against it. Once you are able to get out of bed, walk around indoors and cough well. You may stop using the incentive spirometer when instructed by your caregiver.  RISKS AND COMPLICATIONS Take your time so you do not get dizzy or light-headed. If you are in pain,  you may need to take or ask for pain medication before doing incentive spirometry. It is harder to take a deep breath if you are having pain. AFTER USE Rest and breathe slowly and easily. It can be helpful to keep track of a log of your progress. Your caregiver can provide you with a simple table to help with this. If you are using the spirometer at home, follow these instructions: [place] IF:  You are having difficultly using the spirometer. You have trouble using the spirometer as often as instructed. Your pain medication is not giving enough relief while using the spirometer. You develop fever of 100.5 F (38.1 C) or higher. SEEK IMMEDIATE MEDICAL CARE IF:  You cough up bloody sputum that had not been present before. You develop fever of 102 F (38.9 C) or greater. You develop worsening pain at or near the incision site. MAKE SURE YOU:  Understand these instructions. Will watch your condition. Will get help right away if you are not doing well or get worse. Document Released: 08/07/2006 Document Revised: 06/19/2011 Document Reviewed: 10/08/2006 [facility] Patient Information 2014 [place], [place].   ________________________________________________________________________

## 2020-11-18 ENCOUNTER — Encounter (HOSPITAL_COMMUNITY)
Admission: RE | Admit: 2020-11-18 | Discharge: 2020-11-18 | Disposition: A | Discharge status: Home / Self Care | Payer: 59 | Source: Ambulatory Visit | Attending: Orthopedic Surgery | Admitting: Orthopedic Surgery

## 2020-11-18 ENCOUNTER — Encounter (HOSPITAL_COMMUNITY): Payer: Self-pay

## 2020-11-18 ENCOUNTER — Other Ambulatory Visit: Payer: Self-pay

## 2020-11-18 DIAGNOSIS — Z01818 Encounter for other preprocedural examination: Secondary | ICD-10-CM | POA: Insufficient documentation

## 2020-11-18 HISTORY — DX: Sleep apnea, unspecified: G47.30

## 2020-11-18 LAB — COMPREHENSIVE METABOLIC PANEL
ALT: 29 U/L (ref 0–44)
AST: 25 U/L (ref 15–41)
Albumin: 4.5 g/dL (ref 3.5–5.0)
Alkaline Phosphatase: 64 U/L (ref 38–126)
Anion gap: 7 (ref 5–15)
BUN: 18 mg/dL (ref 6–20)
CO2: 26 mmol/L (ref 22–32)
Calcium: 9.4 mg/dL (ref 8.9–10.3)
Chloride: 104 mmol/L (ref 98–111)
Creatinine, Ser: 0.97 mg/dL (ref 0.61–1.24)
GFR, Estimated: >60 mL/min (ref >60)
Glucose, Bld: 84 mg/dL (ref 70–99)
Potassium: 3.8 mmol/L (ref 3.5–5.1)
Sodium: 137 mmol/L (ref 135–145)
Total Bilirubin: 0.8 mg/dL (ref 0.3–1.2)
Total Protein: 7.4 g/dL (ref 6.5–8.1)

## 2020-11-18 LAB — SURGICAL PCR SCREEN
MRSA, PCR: NEGATIVE
Staphylococcus aureus: NEGATIVE

## 2020-11-18 LAB — CBC
HCT: 47.2 % (ref 39.0–52.0)
Hemoglobin: 16 g/dL (ref 13.0–17.0)
MCH: 30.8 pg (ref 26.0–34.0)
MCHC: 33.9 g/dL (ref 30.0–36.0)
MCV: 90.8 fL (ref 80.0–100.0)
Platelets: 229 10*3/uL (ref 150–400)
RBC: 5.2 MIL/uL (ref 4.22–5.81)
RDW: 13 % (ref 11.5–15.5)
WBC: 4.7 10*3/uL (ref 4.0–10.5)
nRBC: 0 % (ref 0.0–0.2)

## 2020-11-18 NOTE — Progress Notes (Signed)
COVID Vaccine Completed:Yes Date COVID Vaccine completed:total of 4 shots COVID vaccine manufacturer:    Moderna    PCP - Dr. Darleen Crocker LOV 08/27/20 Cardiologist - none  Chest x-ray - no EKG - 11/18/20-chart Stress Test - no ECHO - no Cardiac Cath - no Pacemaker/ICD device last checked:NA  Sleep Study - yes CPAP - yes pressure of 13  Fasting Blood Sugar - NA Checks Blood Sugar _____ times a day  Blood Thinner Instructions:ASA 81/ Dr. Quay Burow Aspirin Instructions:stop 7 days prior to DOS/ Dr. Wynelle Link Last Dose:11/11/20  Anesthesia review: no  Patient denies shortness of breath, fever, cough and chest pain at PAT appointment Pt reports no SOB climbing stairs, doing housework or with ADLs  Patient verbalized understanding of instructions that were given to them at the PAT appointment. Patient was also instructed that they will need to review over the PAT instructions again at home before surgery. yes

## 2020-11-29 ENCOUNTER — Other Ambulatory Visit: Payer: Self-pay | Admitting: Orthopedic Surgery

## 2020-11-30 LAB — SARS CORONAVIRUS 2 (TAT 6-24 HRS): SARS Coronavirus 2: NEGATIVE

## 2020-11-30 MED ORDER — BUPIVACAINE LIPOSOME 1.3 % IJ SUSP
20.0000 mL | Freq: Once | INTRAMUSCULAR | Status: DC
  Filled 2020-11-30: qty 20

## 2020-11-30 NOTE — H&P (Addendum)
TOTAL KNEE ADMISSION H&P  Patient is being admitted for right total knee arthroplasty revision  Subjective:  Chief Complaint: Right knee pain.  HPI: Brandon Franco, 60 y.o. male has a history of pain and functional disability in the right knee due to  failed TKA  and has failed non-surgical conservative treatments for greater than 12 weeks to include NSAID's and/or analgesics and activity modification. Onset of symptoms was gradual, starting  several  years ago with gradually worsening course since that time. The patient noted prior procedures on the knee to include  arthroplasty on the right knee.  Patient currently rates pain in the right knee at 7 out of 10 with activity. Patient has worsening of pain with activity and weight bearing, pain that interferes with activities of daily living, and pain with passive range of motion. There is no active infection.  Patient Active Problem List   Diagnosis Date Noted   Status post total right knee replacement 11/14/2019   Primary osteoarthritis of right knee 10/01/2019   Primary osteoarthritis of left knee 10/01/2019   Unilateral primary osteoarthritis, left knee 12/17/2018   Gastroesophageal reflux disease 12/07/2016   Elevated LFTs 12/07/2016   Acute pain of right knee 09/27/2016   Chronic bilateral low back pain without sciatica 09/27/2016   Degenerative arthritis of hip 04/26/2012    Past Medical History:  Diagnosis Date   Arthritis    Basal cell carcinoma 04/28/2020   right ear   Chronic kidney disease    kidney stone   GERD (gastroesophageal reflux disease)    History of kidney stones    Hypertension    Prostate cancer ([place])    prostate   Right bundle branch block    Sleep apnea    C-Pap    Past Surgical History:  Procedure Laterality Date   APPENDECTOMY     COLONOSCOPY     HERNIA REPAIR     MOHS SURGERY Right 04/28/2020   right ear   PROSTATECTOMY  2016   SHOULDER ARTHROSCOPY Right    TOTAL HIP ARTHROPLASTY  04/26/2012    Procedure: TOTAL HIP ARTHROPLASTY ANTERIOR APPROACH;  Surgeon: Mcarthur Rossetti, MD;  Location: [facility];  Service: Orthopedics;  Laterality: Right;  Right Total Hip Arthroplasty   TOTAL KNEE ARTHROPLASTY Right 11/14/2019   Procedure: RIGHT TOTAL KNEE ARTHROPLASTY;  Surgeon: Mcarthur Rossetti, MD;  Location: [facility];  Service: Orthopedics;  Laterality: Right;    Prior to Admission medications   Medication Sig Start Date End Date Taking? Authorizing Provider  omeprazole (PRILOSEC) 20 MG capsule TAKE 1 CAPSULE BY MOUTH EVERY DAY Patient taking differently: Take 20 mg by mouth in the morning. 04/30/20  Yes Irene Shipper, MD  PAPAV-PHENTOLAMINE-ALPROSTADIL IC 1 Dose by Intracavernosal route daily as needed (sexual intercourse). Trimix Injection   Yes [provider]  valsartan-hydrochlorothiazide (DIOVAN-HCT) 160-12.5 MG tablet Take 1 tablet by mouth in the morning. 10/14/19  Yes [provider]  aspirin EC 81 MG tablet Take 81 mg by mouth in the morning. Swallow whole.    [provider]  Glucosamine HCl 1500 MG TABS Take 1,500 mg by mouth in the morning.    [provider]  Multiple Vitamin (MULTIVITAMIN WITH MINERALS) TABS tablet Take 1 tablet by mouth in the morning.    [provider]    Allergies  Allergen Reactions   Lisinopril Cough    Social History   Socioeconomic History   Marital status: Married    Spouse name: Not  on file   Number of children: 3   Years of education: Not on file   Highest education level: Not on file  Occupational History   Occupation: retired  Tobacco Use   Smoking status: Never   Smokeless tobacco: Never  Vaping Use   Vaping Use: Never used  Substance and Sexual Activity   Alcohol use: Yes    Alcohol/week: 1.0 standard drink    Types: 1 Shots of liquor per week    Comment: socially- occasional   Drug use: No   Sexual activity: Not on file  Other Topics Concern   Not on file  Social History  Narrative   Not on file   Social Determinants of Health   Financial Resource Strain: Not on file  Food Insecurity: Not on file  Transportation Needs: Not on file  Physical Activity: Not on file  Stress: Not on file  Social Connections: Not on file  Intimate Partner Violence: Not on file    Tobacco Use: Low Risk    Smoking Tobacco Use: Never   Smokeless Tobacco Use: Never   Social History   Substance and Sexual Activity  Alcohol Use Yes   Alcohol/week: 1.0 standard drink   Types: 1 Shots of liquor per week   Comment: socially- occasional    Family History  Problem Relation Age of Onset   Colon cancer Paternal Uncle        passed away age 41   Heart disease Mother    Hypertension Father     ROS: Constitutional: no fever, no chills, no night sweats, no significant weight loss Cardiovascular: no chest pain, no palpitations Respiratory: no cough, no shortness of breath, No COPD Gastrointestinal: no vomiting, no nausea Musculoskeletal: no swelling in Joints, Joint Pain Neurologic: no numbness, no tingling, no difficulty with balance   Objective:  Physical Exam: Well nourished and well developed.  General: Alert and oriented x3, cooperative and pleasant, no acute distress.  Head: normocephalic, atraumatic, neck supple.  Eyes: EOMI.  Respiratory: breath sounds clear in all fields, no wheezing, rales, or rhonchi. Cardiovascular: Regular rate and rhythm, no murmurs, gallops or rubs.  Abdomen: non-tender to palpation and soft, normoactive bowel sounds. Musculoskeletal:  The patient has a minimally antalgic gait and ambulates independently.     Right Knee Exam:   No effusion present. No swelling present.   The range of motion is: 5 to 130 degrees.   No crepitus on range of motion of the knee.   No medial joint line tenderness.   No lateral joint line tenderness.   Significant varus/valgus laxity in extension and significant AP laxity in flexion.     The patient's  sensation and motor function are intact in their lower extremities. Their distal pulses are 2+. The bilateral calves are soft and non-tender.   Calves soft and nontender. Motor function intact in LE. Strength 5/5 LE bilaterally. Neuro: Distal pulses 2+. Sensation to light touch intact in LE.  Vital signs in last 24 hours:    Imaging Review Radiographs- AP and lateral of the right knee show the prosthesis in good position with no periprosthetic abnormalities.  Assessment/Plan:  End stage arthritis, right knee   The patient history, physical examination, clinical judgment of the provider and imaging studies are consistent with end stage degenerative joint disease of the right knee and total knee arthroplasty is deemed medically necessary. The treatment options including medical management, injection therapy arthroscopy and arthroplasty were discussed at length. The risks and benefits  of total knee arthroplasty were presented and reviewed. The risks due to aseptic loosening, infection, stiffness, patella tracking problems, thromboembolic complications and other imponderables were discussed. The patient acknowledged the explanation, agreed to proceed with the plan and consent was signed. Patient is being admitted for inpatient treatment for surgery, pain control, PT, OT, prophylactic antibiotics, VTE prophylaxis, progressive ambulation and ADLs and discharge planning. The patient is planning to be discharged  home .   Patient's anticipated LOS is less than 2 midnights, meeting these requirements: - Younger than 75 - Lives within 1 hour of care - Has a competent adult at home to recover with post-op - NO history of  - Chronic pain requiring opioids  - Diabetes  - Coronary Artery Disease  - Heart failure  - Heart attack  - Stroke  - DVT/VTE  - Cardiac arrhythmia  - Respiratory Failure/COPD  - Renal failure  - Anemia  - Advanced Liver disease    Therapy Plans: [facility]  PT Disposition: Home with Wife Planned DVT Prophylaxis: Xarelto '10mg'$  DME Needed: None PCP: Maryjean Ka, MD TXA: IV Allergies: Lisinopril (cough) Anesthesia Concerns: None BMI: 35.5 Last HgbA1c: N/A  Ph[facility]  - Patient was instructed on what medications to stop prior to surgery. - Follow-up visit in 2 weeks with Dr. Wynelle Link - Begin physical therapy following surgery - Pre-operative lab work as pre-surgical testing - Prescriptions will be provided in hospital at time of discharge  Fenton Foy, [facility], PA-C Orthopedic Surgery [facility] [place]

## 2020-12-01 ENCOUNTER — Ambulatory Visit (HOSPITAL_COMMUNITY): Payer: 59 | Admitting: Certified Registered"

## 2020-12-01 ENCOUNTER — Observation Stay (HOSPITAL_COMMUNITY)
Admission: RE | Admit: 2020-12-01 | Discharge: 2020-12-02 | Disposition: A | Discharge status: Home / Self Care | Payer: 59 | Source: Ambulatory Visit | Attending: Orthopedic Surgery | Admitting: Orthopedic Surgery

## 2020-12-01 ENCOUNTER — Encounter (HOSPITAL_COMMUNITY): Payer: Self-pay | Admitting: Orthopedic Surgery

## 2020-12-01 ENCOUNTER — Encounter (HOSPITAL_COMMUNITY)
Admission: RE | Disposition: A | Discharge status: Home / Self Care | Payer: Self-pay | Source: Ambulatory Visit | Attending: Orthopedic Surgery

## 2020-12-01 ENCOUNTER — Other Ambulatory Visit: Payer: Self-pay

## 2020-12-01 DIAGNOSIS — Z79899 Other long term (current) drug therapy: Secondary | ICD-10-CM | POA: Insufficient documentation

## 2020-12-01 DIAGNOSIS — Z96659 Presence of unspecified artificial knee joint: Secondary | ICD-10-CM

## 2020-12-01 DIAGNOSIS — T84022A Instability of internal right knee prosthesis, initial encounter: Secondary | ICD-10-CM | POA: Diagnosis present

## 2020-12-01 DIAGNOSIS — Y792 Prosthetic and other implants, materials and accessory orthopedic devices associated with adverse incidents: Secondary | ICD-10-CM | POA: Insufficient documentation

## 2020-12-01 DIAGNOSIS — I10 Essential (primary) hypertension: Secondary | ICD-10-CM | POA: Diagnosis not present

## 2020-12-01 DIAGNOSIS — Z8546 Personal history of malignant neoplasm of prostate: Secondary | ICD-10-CM | POA: Insufficient documentation

## 2020-12-01 DIAGNOSIS — Z96641 Presence of right artificial hip joint: Secondary | ICD-10-CM | POA: Diagnosis not present

## 2020-12-01 DIAGNOSIS — Z96651 Presence of right artificial knee joint: Secondary | ICD-10-CM

## 2020-12-01 DIAGNOSIS — Z85828 Personal history of other malignant neoplasm of skin: Secondary | ICD-10-CM | POA: Diagnosis not present

## 2020-12-01 DIAGNOSIS — T84018A Broken internal joint prosthesis, other site, initial encounter: Secondary | ICD-10-CM

## 2020-12-01 DIAGNOSIS — T84012A Broken internal right knee prosthesis, initial encounter: Secondary | ICD-10-CM

## 2020-12-01 DIAGNOSIS — Z7982 Long term (current) use of aspirin: Secondary | ICD-10-CM | POA: Diagnosis not present

## 2020-12-01 HISTORY — PX: TOTAL KNEE REVISION: SHX996

## 2020-12-01 SURGERY — TOTAL KNEE REVISION
Anesthesia: Monitor Anesthesia Care | Site: Knee | Laterality: Right

## 2020-12-01 MED ORDER — DEXAMETHASONE SODIUM PHOSPHATE 10 MG/ML IJ SOLN
10.0000 mg | Freq: Once | INTRAMUSCULAR | Status: AC
  Administered 2020-12-02: 10 mg via INTRAVENOUS
  Filled 2020-12-01: qty 1

## 2020-12-01 MED ORDER — RIVAROXABAN 10 MG PO TABS
10.0000 mg | ORAL_TABLET | Freq: Every day | ORAL | Status: DC
  Administered 2020-12-02: 10 mg via ORAL
  Filled 2020-12-01: qty 1

## 2020-12-01 MED ORDER — TRAMADOL HCL 50 MG PO TABS: 50.0000 mg | ORAL_TABLET | Freq: Four times a day (QID) | ORAL | Status: DC | PRN

## 2020-12-01 MED ORDER — CHLORHEXIDINE GLUCONATE 0.12 % MT SOLN
15.0000 mL | Freq: Once | OROMUCOSAL | Status: AC
  Administered 2020-12-01: 15 mL via OROMUCOSAL

## 2020-12-01 MED ORDER — BUPIVACAINE IN DEXTROSE 0.75-8.25 % IT SOLN
INTRATHECAL | Status: DC | PRN
  Administered 2020-12-01: 1.8 mL via INTRATHECAL

## 2020-12-01 MED ORDER — ONDANSETRON HCL 4 MG/2ML IJ SOLN
INTRAMUSCULAR | Status: AC
  Filled 2020-12-01: qty 2

## 2020-12-01 MED ORDER — METOCLOPRAMIDE HCL 5 MG PO TABS: 5.0000 mg | ORAL_TABLET | Freq: Three times a day (TID) | ORAL | Status: DC | PRN

## 2020-12-01 MED ORDER — MORPHINE SULFATE (PF) 2 MG/ML IV SOLN: 0.5000 mg | INTRAVENOUS | Status: DC | PRN

## 2020-12-01 MED ORDER — DIPHENHYDRAMINE HCL 12.5 MG/5ML PO ELIX: 12.5000 mg | ORAL_SOLUTION | ORAL | Status: DC | PRN

## 2020-12-01 MED ORDER — CEFAZOLIN SODIUM-DEXTROSE 2-4 GM/100ML-% IV SOLN
2.0000 g | Freq: Four times a day (QID) | INTRAVENOUS | Status: AC
Start: 2020-12-01 — End: 2020-12-01
  Administered 2020-12-01 (×2): 2 g via INTRAVENOUS
  Filled 2020-12-01 (×2): qty 100

## 2020-12-01 MED ORDER — DEXAMETHASONE SODIUM PHOSPHATE 10 MG/ML IJ SOLN
8.0000 mg | Freq: Once | INTRAMUSCULAR | Status: AC
  Administered 2020-12-01: 8 mg via INTRAVENOUS

## 2020-12-01 MED ORDER — FENTANYL CITRATE (PF) 100 MCG/2ML IJ SOLN
50.0000 ug | INTRAMUSCULAR | Status: AC
  Administered 2020-12-01: 50 ug via INTRAVENOUS
  Filled 2020-12-01: qty 2

## 2020-12-01 MED ORDER — FLEET ENEMA 7-19 GM/118ML RE ENEM: 1.0000 | ENEMA | Freq: Once | RECTAL | Status: DC | PRN

## 2020-12-01 MED ORDER — BUPIVACAINE LIPOSOME 1.3 % IJ SUSP
INTRAMUSCULAR | Status: DC | PRN
  Administered 2020-12-01: 20 mL

## 2020-12-01 MED ORDER — MENTHOL 3 MG MT LOZG: 1.0000 | LOZENGE | OROMUCOSAL | Status: DC | PRN

## 2020-12-01 MED ORDER — DEXAMETHASONE SODIUM PHOSPHATE 10 MG/ML IJ SOLN
INTRAMUSCULAR | Status: DC | PRN
  Administered 2020-12-01: 5 mg

## 2020-12-01 MED ORDER — CEFAZOLIN SODIUM-DEXTROSE 2-4 GM/100ML-% IV SOLN
2.0000 g | INTRAVENOUS | Status: AC
Start: 2020-12-01 — End: 2020-12-01
  Administered 2020-12-01: 2 g via INTRAVENOUS
  Filled 2020-12-01: qty 100

## 2020-12-01 MED ORDER — VALSARTAN-HYDROCHLOROTHIAZIDE 160-12.5 MG PO TABS: 1.0000 | ORAL_TABLET | Freq: Every morning | ORAL | Status: DC

## 2020-12-01 MED ORDER — METHOCARBAMOL 1000 MG/10ML IJ SOLN
500.0000 mg | Freq: Four times a day (QID) | INTRAVENOUS | Status: DC | PRN
  Filled 2020-12-01: qty 5

## 2020-12-01 MED ORDER — ACETAMINOPHEN 10 MG/ML IV SOLN
1000.0000 mg | Freq: Four times a day (QID) | INTRAVENOUS | Status: DC
  Administered 2020-12-01: 1000 mg via INTRAVENOUS
  Filled 2020-12-01: qty 100

## 2020-12-01 MED ORDER — LACTATED RINGERS IV SOLN: INTRAVENOUS | Status: DC

## 2020-12-01 MED ORDER — IRBESARTAN 150 MG PO TABS
150.0000 mg | ORAL_TABLET | Freq: Every day | ORAL | Status: DC
  Administered 2020-12-02: 150 mg via ORAL
  Filled 2020-12-01: qty 1

## 2020-12-01 MED ORDER — ACETAMINOPHEN 10 MG/ML IV SOLN: 1000.0000 mg | Freq: Once | INTRAVENOUS | Status: DC | PRN

## 2020-12-01 MED ORDER — ORAL CARE MOUTH RINSE: 15.0000 mL | Freq: Once | OROMUCOSAL | Status: AC

## 2020-12-01 MED ORDER — DOCUSATE SODIUM 100 MG PO CAPS
100.0000 mg | ORAL_CAPSULE | Freq: Two times a day (BID) | ORAL | Status: DC
  Administered 2020-12-01 – 2020-12-02 (×2): 100 mg via ORAL
  Filled 2020-12-01 (×2): qty 1

## 2020-12-01 MED ORDER — METOCLOPRAMIDE HCL 5 MG/ML IJ SOLN: 5.0000 mg | Freq: Three times a day (TID) | INTRAMUSCULAR | Status: DC | PRN

## 2020-12-01 MED ORDER — TRANEXAMIC ACID-NACL 1000-0.7 MG/100ML-% IV SOLN
1000.0000 mg | INTRAVENOUS | Status: AC
  Administered 2020-12-01: 1000 mg via INTRAVENOUS
  Filled 2020-12-01: qty 100

## 2020-12-01 MED ORDER — HYDROCHLOROTHIAZIDE 12.5 MG PO CAPS
12.5000 mg | ORAL_CAPSULE | Freq: Every day | ORAL | Status: DC
  Administered 2020-12-02: 12.5 mg via ORAL
  Filled 2020-12-01: qty 1

## 2020-12-01 MED ORDER — 0.9 % SODIUM CHLORIDE (POUR BTL) OPTIME
TOPICAL | Status: DC | PRN
  Administered 2020-12-01: 1000 mL

## 2020-12-01 MED ORDER — ACETAMINOPHEN 500 MG PO TABS
1000.0000 mg | ORAL_TABLET | Freq: Four times a day (QID) | ORAL | Status: DC
Start: 2020-12-01 — End: 2020-12-02
  Administered 2020-12-01 – 2020-12-02 (×3): 1000 mg via ORAL
  Filled 2020-12-01 (×4): qty 2

## 2020-12-01 MED ORDER — MIDAZOLAM HCL 2 MG/2ML IJ SOLN
1.0000 mg | INTRAMUSCULAR | Status: AC
  Administered 2020-12-01: 2 mg via INTRAVENOUS
  Filled 2020-12-01: qty 2

## 2020-12-01 MED ORDER — METHOCARBAMOL 500 MG PO TABS
500.0000 mg | ORAL_TABLET | Freq: Four times a day (QID) | ORAL | Status: DC | PRN
  Administered 2020-12-01 – 2020-12-02 (×2): 500 mg via ORAL
  Filled 2020-12-01: qty 1

## 2020-12-01 MED ORDER — POLYETHYLENE GLYCOL 3350 17 G PO PACK: 17.0000 g | PACK | Freq: Every day | ORAL | Status: DC | PRN

## 2020-12-01 MED ORDER — DEXAMETHASONE SODIUM PHOSPHATE 10 MG/ML IJ SOLN
INTRAMUSCULAR | Status: AC
  Filled 2020-12-01: qty 1

## 2020-12-01 MED ORDER — OXYCODONE HCL 5 MG PO TABS
5.0000 mg | ORAL_TABLET | ORAL | Status: DC | PRN
  Administered 2020-12-01 – 2020-12-02 (×2): 10 mg via ORAL
  Filled 2020-12-01 (×2): qty 2

## 2020-12-01 MED ORDER — POVIDONE-IODINE 10 % EX SWAB
2.0000 | Freq: Once | CUTANEOUS | Status: AC
Start: 2020-12-01 — End: 2020-12-01
  Administered 2020-12-01: 2 via TOPICAL

## 2020-12-01 MED ORDER — ONDANSETRON HCL 4 MG PO TABS: 4.0000 mg | ORAL_TABLET | Freq: Four times a day (QID) | ORAL | Status: DC | PRN

## 2020-12-01 MED ORDER — ONDANSETRON HCL 4 MG/2ML IJ SOLN
INTRAMUSCULAR | Status: DC | PRN
  Administered 2020-12-01: 4 mg via INTRAVENOUS

## 2020-12-01 MED ORDER — ONDANSETRON HCL 4 MG/2ML IJ SOLN: 4.0000 mg | Freq: Four times a day (QID) | INTRAMUSCULAR | Status: DC | PRN

## 2020-12-01 MED ORDER — SODIUM CHLORIDE (PF) 0.9 % IJ SOLN
INTRAMUSCULAR | Status: DC | PRN
  Administered 2020-12-01: 60 mL

## 2020-12-01 MED ORDER — FENTANYL CITRATE (PF) 100 MCG/2ML IJ SOLN: 25.0000 ug | INTRAMUSCULAR | Status: DC | PRN

## 2020-12-01 MED ORDER — SODIUM CHLORIDE (PF) 0.9 % IJ SOLN
INTRAMUSCULAR | Status: AC
  Filled 2020-12-01: qty 10

## 2020-12-01 MED ORDER — SODIUM CHLORIDE 0.9 % IV SOLN: INTRAVENOUS | Status: DC

## 2020-12-01 MED ORDER — PROPOFOL 1000 MG/100ML IV EMUL
INTRAVENOUS | Status: AC
  Filled 2020-12-01: qty 100

## 2020-12-01 MED ORDER — BISACODYL 10 MG RE SUPP: 10.0000 mg | Freq: Every day | RECTAL | Status: DC | PRN

## 2020-12-01 MED ORDER — ROPIVACAINE HCL 5 MG/ML IJ SOLN
INTRAMUSCULAR | Status: DC | PRN
  Administered 2020-12-01: 25 mL via PERINEURAL

## 2020-12-01 MED ORDER — PROMETHAZINE HCL 25 MG/ML IJ SOLN: 6.2500 mg | INTRAMUSCULAR | Status: DC | PRN

## 2020-12-01 MED ORDER — PANTOPRAZOLE SODIUM 40 MG PO TBEC
40.0000 mg | DELAYED_RELEASE_TABLET | Freq: Every day | ORAL | Status: DC
  Administered 2020-12-02: 40 mg via ORAL
  Filled 2020-12-01: qty 1

## 2020-12-01 MED ORDER — PHENOL 1.4 % MT LIQD: 1.0000 | OROMUCOSAL | Status: DC | PRN

## 2020-12-01 MED ORDER — PROPOFOL 500 MG/50ML IV EMUL
INTRAVENOUS | Status: DC | PRN
  Administered 2020-12-01: 100 ug/kg/min via INTRAVENOUS

## 2020-12-01 SURGICAL SUPPLY — 56 items
BAG COUNTER SPONGE SURGICOUNT (BAG) ×2 IMPLANT
BAG DECANTER FOR FLEXI CONT (MISCELLANEOUS) ×2 IMPLANT
BAG ZIPLOCK 12X15 (MISCELLANEOUS) IMPLANT
BLADE SAG 18X100X1.27 (BLADE) IMPLANT
BLADE SAW SGTL 11.0X1.19X90.0M (BLADE) IMPLANT
BLADE SURG SZ10 CARB STEEL (BLADE) IMPLANT
BNDG ELASTIC 6X5.8 VLCR STR LF (GAUZE/BANDAGES/DRESSINGS) ×2 IMPLANT
CLOTH BEACON ORANGE TIMEOUT ST (SAFETY) ×2 IMPLANT
CLSR STERI-STRIP ANTIMIC 1/2X4 (GAUZE/BANDAGES/DRESSINGS) ×2 IMPLANT
COVER SURGICAL LIGHT HANDLE (MISCELLANEOUS) ×2 IMPLANT
CUFF TOURN SGL QUICK 34 (TOURNIQUET CUFF) ×1
CUFF TRNQT CYL 34X4.125X (TOURNIQUET CUFF) ×1 IMPLANT
DECANTER SPIKE VIAL GLASS SM (MISCELLANEOUS) IMPLANT
DRAPE U-SHAPE 47X51 STRL (DRAPES) ×2 IMPLANT
DRSG ADAPTIC 3X8 NADH LF (GAUZE/BANDAGES/DRESSINGS) IMPLANT
DRSG AQUACEL AG ADV 3.5X10 (GAUZE/BANDAGES/DRESSINGS) ×2 IMPLANT
DRSG PAD ABDOMINAL 8X10 ST (GAUZE/BANDAGES/DRESSINGS) IMPLANT
DURAPREP 26ML APPLICATOR (WOUND CARE) ×2 IMPLANT
ELECT REM PT RETURN 15FT ADLT (MISCELLANEOUS) ×2 IMPLANT
EVACUATOR 1/8 PVC DRAIN (DRAIN) IMPLANT
GAUZE SPONGE 4X4 12PLY STRL (GAUZE/BANDAGES/DRESSINGS) ×2 IMPLANT
GLOVE SRG 8 PF TXTR STRL LF DI (GLOVE) ×1 IMPLANT
GLOVE SURG ENC MOIS LTX SZ6.5 (GLOVE) ×4 IMPLANT
GLOVE SURG ENC MOIS LTX SZ8 (GLOVE) ×4 IMPLANT
GLOVE SURG UNDER POLY LF SZ7 (GLOVE) ×2 IMPLANT
GLOVE SURG UNDER POLY LF SZ8 (GLOVE) ×1
GLOVE SURG UNDER POLY LF SZ8.5 (GLOVE) IMPLANT
GOWN STRL REUS W/TWL LRG LVL3 (GOWN DISPOSABLE) ×4 IMPLANT
HANDPIECE INTERPULSE COAX TIP (DISPOSABLE) ×1
HOLDER FOLEY CATH W/STRAP (MISCELLANEOUS) ×2 IMPLANT
IMMOBILIZER KNEE 20 (SOFTGOODS)
IMMOBILIZER KNEE 20 THIGH 36 (SOFTGOODS) IMPLANT
INSERT KNEE TIB 5X16 (Insert) ×2 IMPLANT
KIT TURNOVER KIT A (KITS) ×2 IMPLANT
MANIFOLD NEPTUNE II (INSTRUMENTS) ×2 IMPLANT
NS IRRIG 1000ML POUR BTL (IV SOLUTION) ×2 IMPLANT
PACK TOTAL KNEE CUSTOM (KITS) ×2 IMPLANT
PADDING CAST COTTON 6X4 STRL (CAST SUPPLIES) ×4 IMPLANT
PENCIL SMOKE EVACUATOR (MISCELLANEOUS) IMPLANT
PROTECTOR NERVE ULNAR (MISCELLANEOUS) ×2 IMPLANT
SET HNDPC FAN SPRY TIP SCT (DISPOSABLE) ×1 IMPLANT
STRIP CLOSURE SKIN 1/2X4 (GAUZE/BANDAGES/DRESSINGS) ×2 IMPLANT
SUT MNCRL AB 4-0 PS2 18 (SUTURE) ×2 IMPLANT
SUT STRATAFIX 0 PDS 27 VIOLET (SUTURE) ×2
SUT VIC AB 2-0 CT1 27 (SUTURE) ×3
SUT VIC AB 2-0 CT1 TAPERPNT 27 (SUTURE) ×3 IMPLANT
SUTURE STRATFX 0 PDS 27 VIOLET (SUTURE) ×1 IMPLANT
SWAB COLLECTION DEVICE MRSA (MISCELLANEOUS) IMPLANT
SWAB CULTURE ESWAB REG 1ML (MISCELLANEOUS) IMPLANT
SYR 50ML LL SCALE MARK (SYRINGE) ×4 IMPLANT
TOWER CARTRIDGE SMART MIX (DISPOSABLE) IMPLANT
TRAY FOLEY MTR SLVR 16FR STAT (SET/KITS/TRAYS/PACK) ×2 IMPLANT
TUBE KAMVAC SUCTION (TUBING) IMPLANT
TUBE SUCTION HIGH CAP CLEAR NV (SUCTIONS) ×2 IMPLANT
WATER STERILE IRR 1000ML POUR (IV SOLUTION) IMPLANT
WRAP KNEE MAXI GEL POST OP (GAUZE/BANDAGES/DRESSINGS) ×2 IMPLANT

## 2020-12-01 NOTE — Plan of Care (Signed)
  Problem: Education: Goal: Knowledge of General Education information will improve Description: Including pain rating scale, medication(s)/side effects and non-pharmacologic comfort measures Outcome: Progressing   Problem: Activity: Goal: Risk for activity intolerance will decrease Outcome: Progressing   Problem: Nutrition: Goal: Adequate nutrition will be maintained Outcome: Progressing   Problem: Elimination: Goal: Will not experience complications related to bowel motility Outcome: Progressing   Problem: Pain Managment: Goal: General experience of comfort will improve Outcome: Progressing   Problem: Safety: Goal: Ability to remain free from injury will improve Outcome: Progressing   Problem: Education: Goal: Knowledge of the prescribed therapeutic regimen will improve Outcome: Progressing   Problem: Activity: Goal: Ability to avoid complications of mobility impairment will improve Outcome: Progressing Goal: Range of joint motion will improve Outcome: Progressing   Problem: Pain Management: Goal: Pain level will decrease with appropriate interventions Outcome: Progressing

## 2020-12-01 NOTE — Anesthesia Postprocedure Evaluation (Signed)
Anesthesia Post Note  Patient: Brandon Franco  Procedure(s) Performed: Rt knee polyethylene vs total knee arthroplasty revision (Right: Knee)     Patient location during evaluation: PACU Anesthesia Type: Regional, MAC and Spinal Level of consciousness: awake and alert Pain management: pain level controlled Vital Signs Assessment: post-procedure vital signs reviewed and stable Respiratory status: spontaneous breathing, nonlabored ventilation, respiratory function stable and patient connected to nasal cannula oxygen Cardiovascular status: blood pressure returned to baseline and stable Postop Assessment: no apparent nausea or vomiting Anesthetic complications: no   No notable events documented.  Last Vitals:  Vitals:   12/01/20 1252 12/01/20 1423  BP: 121/74 (!) 124/55  Pulse: (!) 45 (!) 55  Resp: 20 16  Temp:  36.6 C  SpO2: 100% 100%    Last Pain:  Vitals:   12/01/20 1423  TempSrc: Oral  PainSc:                  March Rummage Arieanna Pressey

## 2020-12-01 NOTE — Discharge Instructions (Addendum)
Gaynelle Arabian, MD Total Joint Specialist [facility] [address] [place], [place] [address] [phone]  POSTOPERATIVE DIRECTIONS  Knee Rehabilitation, Guidelines Following Surgery  Results after knee surgery are often greatly improved when you follow the exercise, range of motion and muscle strengthening exercises prescribed by your doctor. Safety measures are also important to protect the knee from further injury. If any of these exercises cause you to have increased pain or swelling in your knee joint, decrease the amount until you are comfortable again and slowly increase them. If you have problems or questions, call your caregiver or physical therapist for advice.   BLOOD CLOT PREVENTION Take a 10 mg Xarelto once a day for three weeks following surgery. Then resume one 81 mg aspirin once a day. You may resume your vitamins/supplements once you have discontinued the Xarelto. Do not take any NSAIDs (Advil, Aleve, Ibuprofen, Meloxicam, etc.) until you have discontinued the Xarelto.   HOME CARE INSTRUCTIONS  Remove items at home which could result in a fall. This includes throw rugs or furniture in walking pathways.  ICE to the affected knee as much as tolerated. Icing helps control swelling. If the swelling is well controlled you will be more comfortable and rehab easier. Continue to use ice on the knee for pain and swelling from surgery. You may notice swelling that will progress down to the foot and ankle. This is normal after surgery. Elevate the leg when you are not up walking on it.    Continue to use the breathing machine which will help keep your temperature down. It is common for your temperature to cycle up and down following surgery, especially at night when you are not up moving around and exerting yourself. The breathing machine keeps your lungs expanded and your temperature down. Do not place pillow under the operative knee, focus on keeping the  knee straight while resting  DIET You may resume your previous home diet once you are discharged from the hospital.  DRESSING / WOUND CARE / SHOWERING Keep your bulky bandage on for 2 days. On the third post-operative day you may remove the Ace bandage and gauze. There is a waterproof adhesive bandage on your skin which will stay in place until your first follow-up appointment. Once you remove this you will not need to place another bandage You may begin showering 3 days following surgery, but do not submerge the incision under water.  ACTIVITY For the first 5 days, the key is rest and control of pain and swelling Do your home exercises twice a day starting on post-operative day 3. On the days you go to physical therapy, just do the home exercises once that day. You should rest, ice and elevate the leg for 50 minutes out of every hour. Get up and walk/stretch for 10 minutes per hour. After 5 days you can increase your activity slowly as tolerated. Walk with your walker as instructed. Use the walker until you are comfortable transitioning to a cane. Walk with the cane in the opposite hand of the operative leg. You may discontinue the cane once you are comfortable and walking steadily. Avoid periods of inactivity such as sitting longer than an hour when not asleep. This helps prevent blood clots.  You may discontinue the knee immobilizer once you are able to perform a straight leg raise while lying down. You may resume a sexual relationship in one month or when given the OK by your doctor.  You may return  to work once you are cleared by your doctor.  Do not drive a car for 6 weeks or until released by your surgeon.  Do not drive while taking narcotics.  TED HOSE STOCKINGS Wear the elastic stockings on both legs for three weeks following surgery during the day. You may remove them at night for sleeping.  WEIGHT BEARING Weight bearing as tolerated with assist device (walker, cane, etc) as  directed, use it as long as suggested by your surgeon or therapist, typically at least 4-6 weeks.  POSTOPERATIVE CONSTIPATION PROTOCOL Constipation - defined medically as fewer than three stools per week and severe constipation as less than one stool per week.  One of the most common issues patients have following surgery is constipation.  Even if you have a regular bowel pattern at home, your normal regimen is likely to be disrupted due to multiple reasons following surgery.  Combination of anesthesia, postoperative narcotics, change in appetite and fluid intake all can affect your bowels.  In order to avoid complications following surgery, here are some recommendations in order to help you during your recovery period.  Colace (docusate) - Pick up an over-the-counter form of Colace or another stool softener and take twice a day as long as you are requiring postoperative pain medications.  Take with a full glass of water daily.  If you experience loose stools or diarrhea, hold the colace until you stool forms back up. If your symptoms do not get better within 1 week or if they get worse, check with your doctor. Dulcolax (bisacodyl) - Pick up over-the-counter and take as directed by the product packaging as needed to assist with the movement of your bowels.  Take with a full glass of water.  Use this product as needed if not relieved by Colace only.  MiraLax (polyethylene glycol) - Pick up over-the-counter to have on hand. MiraLax is a solution that will increase the amount of water in your bowels to assist with bowel movements.  Take as directed and can mix with a glass of water, juice, soda, coffee, or tea. Take if you go more than two days without a movement. Do not use MiraLax more than once per day. Call your doctor if you are still constipated or irregular after using this medication for 7 days in a row.  If you continue to have problems with postoperative constipation, please contact the office for  further assistance and recommendations.  If you experience "the worst abdominal pain ever" or develop nausea or vomiting, please contact the office immediatly for further recommendations for treatment.  ITCHING If you experience itching with your medications, try taking only a single pain pill, or even half a pain pill at a time.  You can also use Benadryl over the counter for itching or also to help with sleep.   MEDICATIONS See your medication summary on the "After Visit Summary" that the nursing staff will review with you prior to discharge.  You may have some home medications which will be placed on hold until you complete the course of blood thinner medication.  It is important for you to complete the blood thinner medication as prescribed by your surgeon.  Continue your approved medications as instructed at time of discharge.  PRECAUTIONS If you experience chest pain or shortness of breath - call 911 immediately for transfer to the hospital emergency department.  If you develop a fever greater that 101 F, purulent drainage from wound, increased redness or drainage from wound, foul odor  from the wound/dressing, or calf pain - CONTACT YOUR SURGEON.                                                   FOLLOW-UP APPOINTMENTS Make sure you keep all of your appointments after your operation with your surgeon and caregivers. You should call the office at the above phone number and make an appointment for approximately two weeks after the date of your surgery or on the date instructed by your surgeon outlined in the "After Visit Summary".  RANGE OF MOTION AND STRENGTHENING EXERCISES  Rehabilitation of the knee is important following a knee injury or an operation. After just a few days of immobilization, the muscles of the thigh which control the knee become weakened and shrink (atrophy). Knee exercises are designed to build up the tone and strength of the thigh muscles and to improve knee motion. Often  times heat used for twenty to thirty minutes before working out will loosen up your tissues and help with improving the range of motion but do not use heat for the first two weeks following surgery. These exercises can be done on a training (exercise) mat, on the floor, on a table or on a bed. Use what ever works the best and is most comfortable for you Knee exercises include:  Leg Lifts - While your knee is still immobilized in a splint or cast, you can do straight leg raises. Lift the leg to 60 degrees, hold for 3 sec, and slowly lower the leg. Repeat 10-20 times 2-3 times daily. Perform this exercise against resistance later as your knee gets better.  Quad and Hamstring Sets - Tighten up the muscle on the front of the thigh (Quad) and hold for 5-10 sec. Repeat this 10-20 times hourly. Hamstring sets are done by pushing the foot backward against an object and holding for 5-10 sec. Repeat as with quad sets.  Leg Slides: Lying on your back, slowly slide your foot toward your buttocks, bending your knee up off the floor (only go as far as is comfortable). Then slowly slide your foot back down until your leg is flat on the floor again. Angel Wings: Lying on your back spread your legs to the side as far apart as you can without causing discomfort.  A rehabilitation program following serious knee injuries can speed recovery and prevent re-injury in the future due to weakened muscles. Contact your doctor or a physical therapist for more information on knee rehabilitation.   POST-OPERATIVE OPIOID TAPER INSTRUCTIONS: It is important to wean off of your opioid medication as soon as possible. If you do not need pain medication after your surgery it is ok to stop day one. Opioids include: Codeine, Hydrocodone(Norco, Vicodin), Oxycodone(Percocet, oxycontin) and hydromorphone amongst others.  Long term and even short term use of opiods can cause: Increased pain  response Dependence Constipation Depression Respiratory depression And more.  Withdrawal symptoms can include Flu like symptoms Nausea, vomiting And more Techniques to manage these symptoms Hydrate well Eat regular healthy meals Stay active Use relaxation techniques(deep breathing, meditating, yoga) Do Not substitute Alcohol to help with tapering If you have been on opioids for less than two weeks and do not have pain than it is ok to stop all together.  Plan to wean off of opioids This plan should start within one week  post op of your joint replacement. Maintain the same interval or time between taking each dose and first decrease the dose.  Cut the total daily intake of opioids by one tablet each day Next start to increase the time between doses. The last dose that should be eliminated is the evening dose.   IF YOU ARE TRANSFERRED TO A SKILLED REHAB FACILITY If the patient is transferred to a skilled rehab facility following release from the hospital, a list of the current medications will be sent to the facility for the patient to continue.  When discharged from the skilled rehab facility, please have the facility set up the patient's [place] prior to being released. Also, the skilled facility will be responsible for providing the patient with their medications at time of release from the facility to include their pain medication, the muscle relaxants, and their blood thinner medication. If the patient is still at the rehab facility at time of the two week follow up appointment, the skilled rehab facility will also need to assist the patient in arranging follow up appointment in our office and any transportation needs.  MAKE SURE YOU:  Understand these instructions.  Get help right away if you are not doing well or get worse.   DENTAL ANTIBIOTICS:  In most cases prophylactic antibiotics for Dental procdeures after total joint surgery are not  necessary.  Exceptions are as follows:  1. History of prior total joint infection  2. Severely immunocompromised (Organ Transplant, cancer chemotherapy, Rheumatoid biologic meds such as [place])  3. Poorly controlled diabetes (A1C &gt; 8.0, blood glucose over 200)  If you have one of these conditions, contact your surgeon for an antibiotic prescription, prior to your dental procedure.    Pick up stool softner and laxative for home use following surgery while on pain medications. Do not submerge incision under water. Please use good hand washing techniques while changing dressing each day. May shower starting three days after surgery. Please use a clean towel to pat the incision dry following showers. Continue to use ice for pain and swelling after surgery. Do not use any lotions or creams on the incision until instructed by your surgeon.  Information on my medicine - XARELTO (Rivaroxaban)  This medication education was reviewed with me or my healthcare representative as part of my discharge preparation.  The pharmacist that spoke with me during my hospital stay was:    Why was Xarelto prescribed for you? Xarelto was prescribed for you to reduce the risk of blood clots forming after orthopedic surgery. The medical term for these abnormal blood clots is venous thromboembolism (VTE).  What do you need to know about xarelto ? Take your Xarelto ONCE DAILY at the same time every day. You may take it either with or without food.  If you have difficulty swallowing the tablet whole, you may crush it and mix in applesauce just prior to taking your dose.  Take Xarelto exactly as prescribed by your doctor and DO NOT stop taking Xarelto without talking to the doctor who prescribed the medication.  Stopping without other VTE prevention medication to take the place of Xarelto may increase your risk of developing a clot.  After discharge, you should have regular check-up appointments  with your healthcare provider that is prescribing your Xarelto.    What do you do if you miss a dose? If you miss a dose, take it as soon as you remember on the same day then continue your regularly  scheduled once daily regimen the next day. Do not take two doses of Xarelto on the same day.   Important Safety Information A possible side effect of Xarelto is bleeding. You should call your healthcare provider right away if you experience any of the following: Bleeding from an injury or your nose that does not stop. Unusual colored urine (red or dark brown) or unusual colored stools (red or black). Unusual bruising for unknown reasons. A serious fall or if you hit your head (even if there is no bleeding).  Some medicines may interact with Xarelto and might increase your risk of bleeding while on Xarelto. To help avoid this, consult your healthcare provider or pharmacist prior to using any new prescription or non-prescription medications, including herbals, vitamins, non-steroidal anti-inflammatory drugs (NSAIDs) and supplements.  This website has more information on Xarelto: [email or web address]-benson.[email or web address]

## 2020-12-01 NOTE — Plan of Care (Signed)

## 2020-12-01 NOTE — Progress Notes (Signed)
AssistedDr. Greg Stoltzfus with right, ultrasound guided, adductor canal block. Side rails up, monitors on throughout procedure. See vital signs in flow sheet. Tolerated Procedure well.  

## 2020-12-01 NOTE — Interval H&P Note (Signed)
History and Physical Interval Note:  12/01/2020 8:49 AM  Brandon Franco  has presented today for surgery, with the diagnosis of unstable right total knee arthroplasty.  The various methods of treatment have been discussed with the patient and family. After consideration of risks, benefits and other options for treatment, the patient has consented to  Procedure(s): Rt knee polyethylene vs total knee arthroplasty revision (Right) as a surgical intervention.  The patient's history has been reviewed, patient examined, no change in status, stable for surgery.  I have reviewed the patient's chart and labs.  Questions were answered to the patient's satisfaction.     Pilar Plate [name]

## 2020-12-01 NOTE — Progress Notes (Signed)
Orthopedic Tech Progress Note Patient Details:  Brandon Franco November 01, 1960 [id number]  CPM Right Knee CPM Right Knee: On Right Knee Flexion (Degrees): 40 Right Knee Extension (Degrees): 10  Post Interventions Patient Tolerated: Well Instructions Provided: Care of device  [name] 12/01/2020, 12:03 PM

## 2020-12-01 NOTE — Anesthesia Preprocedure Evaluation (Addendum)
Anesthesia Evaluation  Patient identified by MRN, date of birth, ID band Patient awake    Reviewed: Allergy & Precautions, NPO status , Patient's Chart, lab work & pertinent test results  Airway Mallampati: II  TM Distance: >3 FB Neck ROM: Full    Dental  (+) Teeth Intact   Pulmonary sleep apnea and Continuous Positive Airway Pressure Ventilation ,    Pulmonary exam normal        Cardiovascular hypertension, Pt. on medications + dysrhythmias (RBBB)  Rhythm:Regular Rate:Normal     Neuro/Psych negative neurological ROS  negative psych ROS   GI/Hepatic Neg liver ROS, GERD  Medicated,  Endo/Other  negative endocrine ROS  Renal/GU CRFRenal disease  negative genitourinary   Musculoskeletal  (+) Arthritis , Osteoarthritis,    Abdominal (+)  Abdomen: soft. Bowel sounds: normal.  Peds  Hematology negative hematology ROS (+)   Anesthesia Other Findings   Reproductive/Obstetrics                             Anesthesia Physical Anesthesia Plan  ASA: 2  Anesthesia Plan: MAC, Spinal and Regional   Post-op Pain Management:  Regional for Post-op pain   Induction: Intravenous  PONV Risk Score and Plan: 1 and Propofol infusion, Ondansetron, Dexamethasone, Midazolam and Treatment may vary due to age or medical condition  Airway Management Planned: Simple Face Mask, Natural Airway and Nasal Cannula  Additional Equipment: None  Intra-op Plan:   Post-operative Plan:   Informed Consent: I have reviewed the patients History and Physical, chart, labs and discussed the procedure including the risks, benefits and alternatives for the proposed anesthesia with the patient or authorized representative who has indicated his/her understanding and acceptance.     Dental advisory given  Plan Discussed with: CRNA  Anesthesia Plan Comments: (Lab Results      Component                Value                Date                      WBC                      4.7                 11/18/2020                HGB                      16.0                11/18/2020                HCT                      47.2                11/18/2020                MCV                      90.8                11/18/2020                PLT  229                 11/18/2020           Lab Results      Component                Value               Date                      NA                       137                 11/18/2020                K                        3.8                 11/18/2020                CO2                      26                  11/18/2020                GLUCOSE                  84                  11/18/2020                BUN                      18                  11/18/2020                CREATININE               0.97                11/18/2020                CALCIUM                  9.4                 11/18/2020                GFRNONAA                 >60                 11/18/2020                GFRAA                    >60                 11/15/2019          )       Anesthesia Quick Evaluation

## 2020-12-01 NOTE — Transfer of Care (Signed)
Immediate Anesthesia Transfer of Care Note  Patient: Harlo Fabela  Procedure(s) Performed: Rt knee polyethylene vs total knee arthroplasty revision (Right: Knee)  Patient Location: PACU  Anesthesia Type:MAC and Spinal  Level of Consciousness: awake, alert , oriented and patient cooperative  Airway & Oxygen Therapy: Patient Spontanous Breathing and Patient connected to face mask oxygen  Post-op Assessment: Report given to RN, Post -op Vital signs reviewed and stable and Patient moving all extremities  Post vital signs: Reviewed and stable  Last Vitals:  Vitals Value Taken Time  BP 110/98 12/01/20 1131  Temp    Pulse 57 12/01/20 1133  Resp 18 12/01/20 1133  SpO2 98 % 12/01/20 1133  Vitals shown include unvalidated device data.  Last Pain:  Vitals:   12/01/20 0853  TempSrc:   PainSc: 0-No pain      Patients Stated Pain Goal: 4 (60/73/71 0626)  Complications: No notable events documented.

## 2020-12-01 NOTE — Op Note (Signed)
NAMEBRINDON, MCDORMAN MEDICAL RECORD NO: [id number] ACCOUNT NO: [id number] DATE OF BIRTH: [birth year] FACILITY: [facility] LOCATION: [id number] PHYSICIAN: Dione Plover. Yarima Penman, MD  Operative Report   DATE OF PROCEDURE: 12/01/2020  PREOPERATIVE DIAGNOSIS:  Unstable right total knee arthroplasty.  POSTOPERATIVE DIAGNOSIS:  Unstable right total knee arthroplasty.  PROCEDURE:  Right knee polyethylene revision.  SURGEON:  Dione Plover. Aliya Sol, MD.  ASSISTANTSteffanie Dunn Edmisten, PA-C  ANESTHESIA:  Spinal.  ESTIMATED BLOOD LOSS:  25 mL.  DRAIN:  None.  TOURNIQUET TIME:  21 minutes at 300 mmHg.  COMPLICATIONS:  None.  CONDITION:  Stable to recovery.  BRIEF CLINICAL NOTE:  The patient is a 60 year old male who had a right total knee arthroplasty done over a year ago.  He presented in second opinion earlier this year with an unstable right total knee.  He tried bracing without tremendous improvement.   Infection workup was negative.  He had a bone scan, which showed no evidence of loosening.  He presents now for right knee polyethylene reverse total knee revision.  DESCRIPTION OF PROCEDURE:  After successful administration of spinal anesthetic and adductor canal block a tourniquet was placed high on his right thigh and right lower extremity.  He is prepped and draped in the usual sterile fashion.  Extremity was  wrapped an Esmarch, knee flexed and the tourniquet inflated to 300 mmHg.  Midline incision was made with a 10 blade through subcutaneous tissue to the level of the extensor mechanism.  A fresh blade was used to make a medial parapatellar arthrotomy and  soft tissue of the proximal medial tibia subperiosteally elevated the joint line with a knife and to the semimembranosus bursa with a Cobb elevator.  Soft tissue laterally was also elevated with attention being paid to avoiding the patellar tendon on the  tibial tubercle.  Scar excision was performed throughout the joint and then the patella was everted  and knee flexed 90 degrees.  He had a significant amount of varus valgus laxity in extension and that came all the way through flexion with also  significant AP laxity in flexion.  He had a Careers information officer implant in place with the femur and tibia, both very well fixed and well positioned.  I removed the tibial polyethylene, which is posterior stabilized and 11 mm thick.  We trialed the 13 mm  and there was still significant laxity.  I went to 16 mm, which allowed for excellent varus, valgus, and anterior, posterior balance throughout full range of motion.  Wound was copiously irrigated with saline solution.  I then placed the permanent 16 mm  posterior stabilized insert into the tibial tray.  There was great stability in full extension to varus and valgus stressing and in full flexion anterior, posterior stressing.  Wound was copiously irrigated with saline solution and 20 mL of Exparel mixed  with 60 mL of saline injected into the extensor mechanism periosteum of the femur and posterior capsule.  Arthrotomy was then closed with a running 0 Stratafix suture.  Flexion against gravity was 130 degrees and the patella tracks normally.  Tourniquet  was released total time of 21 minutes.  Subcutaneous was then closed with interrupted 2-0 Vicryl and subcuticular running 4-0 Monocryl.  The incision was cleaned and dried and Steri-Strips and a sterile dressing applied.  He was then awakened and  transported to recovery in stable condition.  Note that a surgical assistant was of medical necessity for this procedure.  Assistant was necessary for retracting vital ligaments  and neurovascular structures and for proper positioning of the limb for safe removal of the old implant and safe and  accurate placement of the new implant.   PUS D: 12/01/2020 11:07:09 am T: 12/01/2020 4:58:00 pm  JOB: [id number]

## 2020-12-01 NOTE — Anesthesia Procedure Notes (Signed)
Spinal  Patient location during procedure: OR Start time: 12/01/2020 10:07 AM End time: 12/01/2020 10:10 AM Reason for block: surgical anesthesia Staffing Performed: anesthesiologist  Anesthesiologist: Darral Dash, DO Preanesthetic Checklist Completed: patient identified, IV checked, site marked, risks and benefits discussed, surgical consent, monitors and equipment checked, pre-op evaluation and timeout performed Spinal Block Patient position: sitting Prep: DuraPrep Patient monitoring: heart rate, cardiac monitor, continuous pulse ox and blood pressure Approach: midline Location: L3-4 Injection technique: single-shot Needle Needle type: Sprotte  Needle gauge: 24 G Needle length: 9 cm Assessment Sensory level: T4 Events: CSF return Additional Notes Patient identified. Risks/Benefits/Options discussed with patient including but not limited to bleeding, infection, nerve damage, paralysis, failed block, incomplete pain control, headache, blood pressure changes, nausea, vomiting, reactions to medications, itching and postpartum back pain. Confirmed with bedside nurse the patient's most recent platelet count. Confirmed with patient that they are not currently taking any anticoagulation, have any bleeding history or any family history of bleeding disorders. Patient expressed understanding and wished to proceed. All questions were answered. Sterile technique was used throughout the entire procedure. Please see nursing notes for vital signs. Warning signs of high block given to the patient including shortness of breath, tingling/numbness in hands, complete motor block, or any concerning symptoms with instructions to call for help. Patient was given instructions on fall risk and not to get out of bed. All questions and concerns addressed with instructions to call with any issues or inadequate analgesia.

## 2020-12-01 NOTE — Evaluation (Signed)
Physical Therapy Evaluation Patient Details Name: Brandon Franco MRN: [id number] DOB: [birth year] Today's Date: 12/01/2020   History of Present Illness  60 yo male s/p R TK poly revision 12/01/20. Hx of R TKA 2021, R THA DA  Clinical Impression  On eval POD 0, pt was Supv level for mobility. He walked ~250 feet around the unit. Minimal pain with activity. Anticipate pt will continue to progress well.     Follow Up Recommendations Follow surgeon's recommendation for DC plan and follow-up therapies    Equipment Recommendations  None recommended by PT    Recommendations for Other Services       Precautions / Restrictions Precautions Precautions: Knee Restrictions Weight Bearing Restrictions: No Other Position/Activity Restrictions: WBAT      Mobility  Bed Mobility Overal bed mobility: Modified Independent                  Transfers Overall transfer level: Needs assistance   Transfers: Sit to/from Stand Sit to Stand: Supervision         General transfer comment: Supv for safety, cues.  Ambulation/Gait Ambulation/Gait assistance: Supervision Gait Distance (Feet): 250 Feet Assistive device: Rolling walker (2 wheeled) Gait Pattern/deviations: Step-through pattern     General Gait Details: Supv for safety. No LOB. Pt denied dizziness. Tolerated distance well.  Stairs            Wheelchair Mobility    Modified Rankin (Stroke Patients Only)       Balance Overall balance assessment: Mild deficits observed, not formally tested                                           Pertinent Vitals/Pain Pain Assessment: 0-10 Pain Score: 2  Pain Location: R knee Pain Descriptors / Indicators: Sore Pain Intervention(s): Monitored during session;Ice applied;Repositioned    Home Living Family/patient expects to be discharged to:: Private residence Living Arrangements: Spouse/significant other Available Help at Discharge: Family Type of Home:  House Home Access: Stairs to enter Entrance Stairs-Rails: Left Entrance Stairs-Number of Steps: 4 Home Layout: Able to live on main level with bedroom/bathroom Home Equipment: Walker - 2 wheels;Crutches;Cane - single point;Bedside commode      Prior Function Level of Independence: Independent               Hand Dominance        Extremity/Trunk Assessment   Upper Extremity Assessment Upper Extremity Assessment: Overall WFL for tasks assessed    Lower Extremity Assessment Lower Extremity Assessment: Overall WFL for tasks assessed    Cervical / Trunk Assessment Cervical / Trunk Assessment: Normal  Communication   Communication: No difficulties  Cognition Arousal/Alertness: Awake/alert Behavior During Therapy: WFL for tasks assessed/performed Overall Cognitive Status: Within Functional Limits for tasks assessed                                        General Comments      Exercises     Assessment/Plan    PT Assessment Patient needs continued PT services  PT Problem List Decreased strength;Decreased range of motion;Decreased mobility;Decreased activity tolerance;Decreased knowledge of use of DME;Pain;Decreased balance       PT Treatment Interventions DME instruction;Gait training;Therapeutic exercise;Balance training;Stair training;Functional mobility training;Therapeutic activities;Patient/family education    PT Goals (Current goals can be  found in the Care Plan section)  Acute Rehab PT Goals Patient Stated Goal: to regain independence/PLOF PT Goal Formulation: With patient Time For Goal Achievement: 12/15/20 Potential to Achieve Goals: Good    Frequency 7X/week   Barriers to discharge        Co-evaluation               AM-PAC PT "6 Clicks" Mobility  Outcome Measure Help needed turning from your back to your side while in a flat bed without using bedrails?: None Help needed moving from lying on your back to sitting on the side  of a flat bed without using bedrails?: None Help needed moving to and from a bed to a chair (including a wheelchair)?: None Help needed standing up from a chair using your arms (e.g., wheelchair or bedside chair)?: None Help needed to walk in hospital room?: A Little Help needed climbing 3-5 steps with a railing? : A Little 6 Click Score: 22    End of Session Equipment Utilized During Treatment: Gait belt Activity Tolerance: Patient tolerated treatment well Patient left: in chair;with call bell/phone within reach   PT Visit Diagnosis: Other abnormalities of gait and mobility (R26.89);Pain Pain - Right/Left: Left Pain - part of body: Knee    Time: [id number] PT Time Calculation (min) (ACUTE ONLY): 20 min   Charges:   PT Evaluation $PT Eval Low Complexity: [place], PT Acute Rehabilitation  Office: [phone] Pager: [phone]

## 2020-12-01 NOTE — Brief Op Note (Signed)
12/01/2020  11:02 AM  PATIENT:  Brandon Franco  60 y.o. male  PRE-OPERATIVE DIAGNOSIS:  unstable right total knee arthroplasty  POST-OPERATIVE DIAGNOSIS:  unstable right total knee arthroplasty  PROCEDURE:  Procedure(s): Rt knee polyethylene vs total knee arthroplasty revision (Right)  SURGEON:  Surgeon(s) and Role:    Gaynelle Arabian, MD - Primary  PHYSICIAN ASSISTANT:   ASSISTANTS: Theresa Duty, PA-C   ANESTHESIA:    Adductor canal block and spinal  EBL:  25 ml  BLOOD ADMINISTERED:none  DRAINS: none   LOCAL MEDICATIONS USED:  OTHER Exparel  COUNTS:  YES  TOURNIQUET:  * Missing tourniquet times found for documented tourniquets in log: [id number] *  DICTATION: .Other Dictation: Dictation Number [id number]  PLAN OF CARE: Admit for overnight observation  PATIENT DISPOSITION:  PACU - hemodynamically stable.

## 2020-12-01 NOTE — Anesthesia Procedure Notes (Signed)
Anesthesia Regional Block: Adductor canal block   Pre-Anesthetic Checklist: , timeout performed,  Correct Patient, Correct Site, Correct Laterality,  Correct Procedure, Correct Position, site marked,  Risks and benefits discussed,  Surgical consent,  Pre-op evaluation,  At surgeon's request and post-op pain management  Laterality: Right  Prep: Dura Prep       Needles:  Injection technique: Single-shot  Needle Type: Echogenic Stimulator Needle     Needle Length: 10cm  Needle Gauge: 20     Additional Needles:   Procedures:,,,, ultrasound used (permanent image in chart),,    Narrative:  Start time: 12/01/2020 9:19 AM End time: 12/01/2020 9:22 AM Injection made incrementally with aspirations every 5 mL.  Performed by: Personally  Anesthesiologist: Darral Dash, DO  Additional Notes: Patient identified. Risks/Benefits/Options discussed with patient including but not limited to bleeding, infection, nerve damage, failed block, incomplete pain control. Patient expressed understanding and wished to proceed. All questions were answered. Sterile technique was used throughout the entire procedure. Please see nursing notes for vital signs. Aspirated in 5cc intervals with injection for negative confirmation. Patient was given instructions on fall risk and not to get out of bed. All questions and concerns addressed with instructions to call with any issues or inadequate analgesia.

## 2020-12-02 ENCOUNTER — Encounter (HOSPITAL_COMMUNITY): Payer: Self-pay | Admitting: Orthopedic Surgery

## 2020-12-02 DIAGNOSIS — T84022A Instability of internal right knee prosthesis, initial encounter: Secondary | ICD-10-CM | POA: Diagnosis not present

## 2020-12-02 LAB — BASIC METABOLIC PANEL
Anion gap: 5 (ref 5–15)
BUN: 14 mg/dL (ref 6–20)
CO2: 25 mmol/L (ref 22–32)
Calcium: 9 mg/dL (ref 8.9–10.3)
Chloride: 107 mmol/L (ref 98–111)
Creatinine, Ser: 0.82 mg/dL (ref 0.61–1.24)
GFR, Estimated: >60 mL/min (ref >60)
Glucose, Bld: 156 mg/dL — ABNORMAL HIGH (ref 70–99)
Potassium: 3.8 mmol/L (ref 3.5–5.1)
Sodium: 137 mmol/L (ref 135–145)

## 2020-12-02 LAB — CBC
HCT: 43.2 % (ref 39.0–52.0)
Hemoglobin: 14.9 g/dL (ref 13.0–17.0)
MCH: 31.5 pg (ref 26.0–34.0)
MCHC: 34.5 g/dL (ref 30.0–36.0)
MCV: 91.3 fL (ref 80.0–100.0)
Platelets: 232 10*3/uL (ref 150–400)
RBC: 4.73 MIL/uL (ref 4.22–5.81)
RDW: 12.8 % (ref 11.5–15.5)
WBC: 11.7 10*3/uL — ABNORMAL HIGH (ref 4.0–10.5)
nRBC: 0 % (ref 0.0–0.2)

## 2020-12-02 MED ORDER — TRAMADOL HCL 50 MG PO TABS: 50.0000 mg | ORAL_TABLET | Freq: Four times a day (QID) | ORAL | 0 refills | Status: DC | PRN

## 2020-12-02 MED ORDER — OXYCODONE HCL 5 MG PO TABS: 5.0000 mg | ORAL_TABLET | Freq: Four times a day (QID) | ORAL | 0 refills | Status: DC | PRN

## 2020-12-02 MED ORDER — RIVAROXABAN 10 MG PO TABS: 10.0000 mg | ORAL_TABLET | Freq: Every day | ORAL | 0 refills | Status: DC

## 2020-12-02 MED ORDER — METHOCARBAMOL 500 MG PO TABS: 500.0000 mg | ORAL_TABLET | Freq: Four times a day (QID) | ORAL | 0 refills | Status: DC | PRN

## 2020-12-02 NOTE — Progress Notes (Signed)
Subjective: 1 Day Post-Op Procedure(s) (LRB): Rt knee polyethylene vs total knee arthroplasty revision (Right) Patient reports pain as mild.   Patient seen in rounds by Dr. Wynelle Link. Patient is well, and has had no acute complaints or problems. Denies SOB, chest pain, or calf pain. No acute overnight events. Ambulated 250 feet with therapy yesterday. Will continue therapy today. Foley pulled this am.    Objective: Vital signs in last 24 hours: Temp:  [97.5 F (36.4 C)-98.8 F (37.1 C)] 97.8 F (36.6 C) (08/25 0617) Pulse Rate:  [44-73] 50 (08/25 0617) Resp:  [11-24] 16 (08/25 0617) BP: (109-150)/(55-98) 121/79 (08/25 0617) SpO2:  [95 %-100 %] 100 % (08/25 0617) Weight:  [118.8 kg] 118.8 kg (08/24 0853)  Intake/Output from previous day:  Intake/Output Summary (Last 24 hours) at 12/02/2020 0718 Last data filed at 12/02/2020 0600 Gross per 24 hour  Intake 2799.89 ml  Output 4565 ml  Net -1765.11 ml     Intake/Output this shift: No intake/output data recorded.  Labs: Recent Labs    12/02/20 0303  HGB 14.9   Recent Labs    12/02/20 0303  WBC 11.7*  RBC 4.73  HCT 43.2  PLT 232   Recent Labs    12/02/20 0303  NA 137  K 3.8  CL 107  CO2 25  BUN 14  CREATININE 0.82  GLUCOSE 156*  CALCIUM 9.0   No results for input(s): LABPT, INR in the last 72 hours.  Exam: General - Patient is Alert and Oriented Extremity - Neurologically intact Neurovascular intact Intact pulses distally Dorsiflexion/Plantar flexion intact Dressing - dressing C/D/I Motor Function - intact, moving foot and toes well on exam.   Past Medical History:  Diagnosis Date   Arthritis    Basal cell carcinoma 04/28/2020   right ear   Chronic kidney disease    kidney stone   GERD (gastroesophageal reflux disease)    History of kidney stones    Hypertension    Prostate cancer ([place])    prostate   Right bundle branch block    Sleep apnea    C-Pap    Assessment/Plan: 1 Day Post-Op  Procedure(s) (LRB): Rt knee polyethylene vs total knee arthroplasty revision (Right) Principal Problem:   Failed total knee arthroplasty ([place]) Active Problems:   Failed total right knee replacement ([facility])  Estimated body mass index is 34.57 kg/m as calculated from the following:   Height as of this encounter: '6\' 1"'$  (1.854 m).   Weight as of this encounter: 118.8 kg. Advance diet Up with therapy   Patient's anticipated LOS is less than 2 midnights, meeting these requirements: - Younger than 64 - Lives within 1 hour of care - Has a competent adult at home to recover with post-op - NO history of  - Chronic pain requiring opioids  - Diabetes  - Coronary Artery Disease  - Heart failure  - Heart attack  - Stroke  - DVT/VTE  - Cardiac arrhythmia  - Respiratory Failure/COPD  - Renal failure  - Anemia  - Advanced Liver disease     DVT Prophylaxis - Xarelto Weight bearing as tolerated. Continue therapy.  Plan is to go Home after hospital stay.  Plan for one session with PT this morning, and if meeting goals, will plan for discharge this afternoon.   Patient to follow up in two weeks with Dr. Wynelle Link in clinic.   The PDMP database was reviewed today prior to any opioid medications being prescribed to this patient.Marland Kitchen  Fenton Foy, MBA, PA-C Orthopedic Surgery 12/02/2020, 7:18 AM

## 2020-12-02 NOTE — Progress Notes (Signed)
Physical Therapy Treatment Patient Details Name: Brandon Franco MRN: [id number] DOB: [birth year] Today's Date: 12/02/2020    History of Present Illness 60 yo male s/p R TK poly revision 12/01/20. Hx of R TKA 2021, R THA DA    PT Comments    Pt dressed and in recliner on arrival.  Pt able to recall exercises well.  Pt also ambulated in hallway and performed safe stair technique.  Pt ready for d/c home today.     Follow Up Recommendations  Follow surgeon's recommendation for DC plan and follow-up therapies     Equipment Recommendations  None recommended by PT    Recommendations for Other Services       Precautions / Restrictions Precautions Precautions: Knee Restrictions Other Position/Activity Restrictions: WBAT    Mobility  Bed Mobility                    Transfers Overall transfer level: Needs assistance Equipment used: Rolling walker (2 wheeled) Transfers: Sit to/from Stand Sit to Stand: Supervision         General transfer comment: performed in safe technique  Ambulation/Gait Ambulation/Gait assistance: Supervision Gait Distance (Feet): 200 Feet Assistive device: Rolling walker (2 wheeled) Gait Pattern/deviations: Step-through pattern     General Gait Details: supervision for safety, no antalgic gait observed, no c/o symptoms   Stairs Stairs: Yes Stairs assistance: Supervision Stair Management: Step to pattern;Forwards;One rail Left Number of Stairs: 3 General stair comments: pt able to recall safe stair technique, performed well   Wheelchair Mobility    Modified Rankin (Stroke Patients Only)       Balance                                            Cognition Arousal/Alertness: Awake/alert Behavior During Therapy: WFL for tasks assessed/performed Overall Cognitive Status: Within Functional Limits for tasks assessed                                        Exercises Total Joint Exercises Ankle  Circles/Pumps: AROM;Both;10 reps Quad Sets: AROM;Right;10 reps Hip ABduction/ADduction: AROM;Right;10 reps Straight Leg Raises: AROM;Right;10 reps Long Arc Quad: AROM;Right;10 reps Knee Flexion: AROM;Seated;Right;10 reps    General Comments        Pertinent Vitals/Pain Pain Assessment: 0-10 Pain Score: 2  Pain Location: R knee Pain Descriptors / Indicators: Sore Pain Intervention(s): Repositioned;Monitored during session;Ice applied    Home Living                      Prior Function            PT Goals (current goals can now be found in the care plan section) Progress towards PT goals: Progressing toward goals    Frequency    7X/week      PT Plan Current plan remains appropriate    Co-evaluation              AM-PAC PT "6 Clicks" Mobility   Outcome Measure  Help needed turning from your back to your side while in a flat bed without using bedrails?: None Help needed moving from lying on your back to sitting on the side of a flat bed without using bedrails?: None Help needed moving to and from a bed to  a chair (including a wheelchair)?: None Help needed standing up from a chair using your arms (e.g., wheelchair or bedside chair)?: None Help needed to walk in hospital room?: A Little Help needed climbing 3-5 steps with a railing? : A Little 6 Click Score: 22    End of Session Equipment Utilized During Treatment: Gait belt Activity Tolerance: Patient tolerated treatment well Patient left: in chair;with call bell/phone within reach   PT Visit Diagnosis: Other abnormalities of gait and mobility (R26.89)     Time: [id number] PT Time Calculation (min) (ACUTE ONLY): 17 min  Charges:  $Gait Training: 8-22 mins                     Arlyce Dice, DPT Acute Rehabilitation Services Pager: [phone] Office: [place] 12/02/2020, 3:33 PM

## 2020-12-02 NOTE — TOC Transition Note (Signed)
Transition of Care [facility]) - CM/SW Discharge Note   Patient Details  Name: Brandon Franco MRN: [id number] Date of Birth: [birth year]  [facility] CM/SW Contact:  Lennart Pall, LCSW Phone Number: 12/02/2020, 10:03 AM   Clinical Narrative:    Met with pt and confirming he has all needed DME. Plan for OPPT at [facility]. No further TOC needs.   Final next level of care: OP Rehab Barriers to Discharge: No Barriers Identified   Patient Goals and CMS Choice Patient states their goals for this hospitalization and ongoing recovery are:: return home      Discharge Placement                       Discharge Plan and Services                DME Arranged: N/A DME Agency: NA                  Social Determinants of Health (SDOH) Interventions     Readmission Risk Interventions No flowsheet data found.

## 2020-12-20 NOTE — Discharge Summary (Signed)
Physician Discharge Summary   Patient ID: Brandon Franco MRN: [id number] DOB/AGE: 1960/06/04 60 y.o.  Admit date: 12/01/2020 Discharge date: 12/02/2020  Primary Diagnosis: Unstable Right TKA  Admission Diagnoses:  Past Medical History:  Diagnosis Date   Arthritis    Basal cell carcinoma 04/28/2020   right ear   Chronic kidney disease    kidney stone   GERD (gastroesophageal reflux disease)    History of kidney stones    Hypertension    Prostate cancer ([place])    prostate   Right bundle branch block    Sleep apnea    C-Pap   Discharge Diagnoses:   Principal Problem:   Failed total knee arthroplasty ([place]) Active Problems:   Failed total right knee replacement ([place])  Estimated body mass index is 34.57 kg/m as calculated from the following:   Height as of this encounter: '6\' 1"'$  (1.854 m).   Weight as of this encounter: 118.8 kg.  Procedure:  Procedure(s) (LRB): Rt knee polyethylene vs total knee arthroplasty revision (Right)   Consults: None  HPI: The patient is a 60 year old male who had a right total knee arthroplasty done over a year ago.  He presented in second opinion earlier this year with an unstable right total knee.  He tried bracing without tremendous improvement.   Infection workup was negative.  He had a bone scan, which showed no evidence of loosening.  He presents now for right knee polyethylene reverse total knee revision.  Laboratory Data: Admission on 12/01/2020, Discharged on 12/02/2020  Component Date Value Ref Range Status   WBC 12/02/2020 11.7 (A) 4.0 - 10.5 K/uL Final   RBC 12/02/2020 4.73  4.22 - 5.81 MIL/uL Final   Hemoglobin 12/02/2020 14.9  13.0 - 17.0 g/dL Final   HCT 12/02/2020 43.2  39.0 - 52.0 % Final   MCV 12/02/2020 91.3  80.0 - 100.0 fL Final   MCH 12/02/2020 31.5  26.0 - 34.0 pg Final   MCHC 12/02/2020 34.5  30.0 - 36.0 g/dL Final   RDW 12/02/2020 12.8  11.5 - 15.5 % Final   Platelets 12/02/2020 232  150 - 400 K/uL Final   nRBC  12/02/2020 0.0  0.0 - 0.2 % Final   Performed at [facility], [place] [address] [place], [place] [address]   Sodium 12/02/2020 137  135 - 145 mmol/L Final   Potassium 12/02/2020 3.8  3.5 - 5.1 mmol/L Final   Chloride 12/02/2020 107  98 - 111 mmol/L Final   CO2 12/02/2020 25  22 - 32 mmol/L Final   Glucose, Bld 12/02/2020 156 (A) 70 - 99 mg/dL Final   Glucose reference range applies only to samples taken after fasting for at least 8 hours.   BUN 12/02/2020 14  6 - 20 mg/dL Final   Creatinine, Ser 12/02/2020 0.82  0.61 - 1.24 mg/dL Final   Calcium 12/02/2020 9.0  8.9 - 10.3 mg/dL Final   GFR, Estimated 12/02/2020 >60  >60 mL/min Final   Comment: (NOTE) Calculated using the CKD-EPI Creatinine Equation (2021)    Anion gap 12/02/2020 5  5 - 15 Final   Performed at [facility], [place] [address], [place], [place] [address]  Orders Only on 11/29/2020  Component Date Value Ref Range Status   SARS Coronavirus 2 11/29/2020 RESULT: NEGATIVE   Final   Comment: RESULT: NEGATIVESARS-CoV-2 INTERPRETATION:A NEGATIVE  test result means that SARS-CoV-2 RNA was not present in the specimen above the limit of detection of this test.  This does not preclude a possible SARS-CoV-2 infection and should not be used as the  sole basis for patient management decisions. Negative results must be combined with clinical observations, patient history, and epidemiological information. Optimum specimen types and timing for peak viral levels during infections caused by SARS-CoV-2  have not been determined. Collection of multiple specimens or types of specimens may be necessary to detect virus. Improper specimen collection and handling, sequence variability under primers/probes, or organism present below the limit of detection may  lead to false negative results. Positive and negative predictive values of testing are highly dependent on prevalence. False negative test results are more likely when  prevalence of disease is high.The expected result is NEGATIVE.Fact S                          heet for  Healthcare Providers: LocalChronicle.no Sheet for Patients: SalonLookup.es Reference Range - Negative   Hospital Outpatient Visit on 11/18/2020  Component Date Value Ref Range Status   WBC 11/18/2020 4.7  4.0 - 10.5 K/uL Final   RBC 11/18/2020 5.20  4.22 - 5.81 MIL/uL Final   Hemoglobin 11/18/2020 16.0  13.0 - 17.0 g/dL Final   HCT 11/18/2020 47.2  39.0 - 52.0 % Final   MCV 11/18/2020 90.8  80.0 - 100.0 fL Final   MCH 11/18/2020 30.8  26.0 - 34.0 pg Final   MCHC 11/18/2020 33.9  30.0 - 36.0 g/dL Final   RDW 11/18/2020 13.0  11.5 - 15.5 % Final   Platelets 11/18/2020 229  150 - 400 K/uL Final   nRBC 11/18/2020 0.0  0.0 - 0.2 % Final   Performed at [facility], [place] [address], [place], [place] [address]   Sodium 11/18/2020 137  135 - 145 mmol/L Final   Potassium 11/18/2020 3.8  3.5 - 5.1 mmol/L Final   Chloride 11/18/2020 104  98 - 111 mmol/L Final   CO2 11/18/2020 26  22 - 32 mmol/L Final   Glucose, Bld 11/18/2020 84  70 - 99 mg/dL Final   Glucose reference range applies only to samples taken after fasting for at least 8 hours.   BUN 11/18/2020 18  6 - 20 mg/dL Final   Creatinine, Ser 11/18/2020 0.97  0.61 - 1.24 mg/dL Final   Calcium 11/18/2020 9.4  8.9 - 10.3 mg/dL Final   Total Protein 11/18/2020 7.4  6.5 - 8.1 g/dL Final   Albumin 11/18/2020 4.5  3.5 - 5.0 g/dL Final   AST 11/18/2020 25  15 - 41 U/L Final   ALT 11/18/2020 29  0 - 44 U/L Final   Alkaline Phosphatase 11/18/2020 64  38 - 126 U/L Final   Total Bilirubin 11/18/2020 0.8  0.3 - 1.2 mg/dL Final   GFR, Estimated 11/18/2020 >60  >60 mL/min Final   Comment: (NOTE) Calculated using the CKD-EPI Creatinine Equation (2021)    Anion gap 11/18/2020 7  5 - 15 Final   Performed at [facility], [place] [address] [place],  [place] [address]   MRSA, PCR 11/18/2020 NEGATIVE  NEGATIVE Final   Staphylococcus aureus 11/18/2020 NEGATIVE  NEGATIVE Final   Comment: (NOTE) The Xpert SA Assay (FDA approved for NASAL specimens in patients 63 years of age and older), is one component of a comprehensive surveillance program. It is not intended to diagnose infection nor to guide or monitor treatment. Performed at [facility], [place] [address], [place], [place] [address]  X-Rays:No results found.  EKG: Orders placed or performed during the hospital encounter of 11/18/20   EKG 12 lead per protocol   EKG 12 lead per protocol     Hospital Course: Ashden Poteete is a 60 y.o. who was admitted to [facility]. They were brought to the operating room on 12/01/2020 and underwent Procedure(s): Rt knee polyethylene vs total knee arthroplasty revision.  Patient tolerated the procedure well and was later transferred to the recovery room and then to the orthopaedic floor for postoperative care. They were given PO and IV analgesics for pain control following their surgery. They were given 24 hours of postoperative antibiotics of  Anti-infectives (From admission, onward)    Start     Dose/Rate Route Frequency Ordered Stop   12/01/20 1600  ceFAZolin (ANCEF) IVPB 2g/100 mL premix        2 g 200 mL/hr over 30 Minutes Intravenous Every 6 hours 12/01/20 1300 12/01/20 2205   12/01/20 0845  ceFAZolin (ANCEF) IVPB 2g/100 mL premix        2 g 200 mL/hr over 30 Minutes Intravenous On call to O.R. 12/01/20 0830 12/01/20 1015      and started on DVT prophylaxis in the form of Xarelto and TED hose.   PT and OT were ordered for total joint protocol. Discharge planning consulted to help with postop disposition and equipment needs.  Patient had an uneventful night on the evening of surgery. They started to get up OOB with therapy on 12/01/2020. Pt was seen during rounds and was ready to go home pending progress with therapy. He  worked with therapy on POD #1 and was meeting goals. Pt was discharged to home later that day in stable condition.  Diet: Regular diet Activity: WBAT Follow-up: in two weeks Disposition: Home Discharged Condition: good   Discharge Instructions     Call MD / Call 911   Complete by: As directed    If you experience chest pain or shortness of breath, CALL 911 and be transported to the hospital emergency room.  If you develope a fever above 101 F, pus (white drainage) or increased drainage or redness at the wound, or calf pain, call your surgeon's office.   Change dressing   Complete by: As directed    You may remove the bulky bandage (ACE wrap and gauze) two days after surgery. You will have an adhesive waterproof bandage underneath. Leave this in place until your first follow-up appointment.   Constipation Prevention   Complete by: As directed    Drink plenty of fluids.  Prune juice may be helpful.  You may use a stool softener, such as Colace (over the counter) 100 mg twice a day.  Use MiraLax (over the counter) for constipation as needed.   Diet - low sodium heart healthy   Complete by: As directed    Do not put a pillow under the knee. Place it under the heel.   Complete by: As directed    Driving restrictions   Complete by: As directed    No driving for two weeks   Post-operative opioid taper instructions:   Complete by: As directed    POST-OPERATIVE OPIOID TAPER INSTRUCTIONS: It is important to wean off of your opioid medication as soon as possible. If you do not need pain medication after your surgery it is ok to stop day one. Opioids include: Codeine, Hydrocodone(Norco, Vicodin), Oxycodone(Percocet, oxycontin) and hydromorphone amongst others.  Long term and even short term use of  opiods can cause: Increased pain response Dependence Constipation Depression Respiratory depression And more.  Withdrawal symptoms can include Flu like symptoms Nausea, vomiting And  more Techniques to manage these symptoms Hydrate well Eat regular healthy meals Stay active Use relaxation techniques(deep breathing, meditating, yoga) Do Not substitute Alcohol to help with tapering If you have been on opioids for less than two weeks and do not have pain than it is ok to stop all together.  Plan to wean off of opioids This plan should start within one week post op of your joint replacement. Maintain the same interval or time between taking each dose and first decrease the dose.  Cut the total daily intake of opioids by one tablet each day Next start to increase the time between doses. The last dose that should be eliminated is the evening dose.      TED hose   Complete by: As directed    Use stockings (TED hose) for three weeks on both leg(s).  You may remove them at night for sleeping.   Weight bearing as tolerated   Complete by: As directed       Allergies as of 12/02/2020       Reactions   Lisinopril Cough        Medication List     STOP taking these medications    aspirin EC 81 MG tablet   Glucosamine HCl 1500 MG Tabs   multivitamin with minerals Tabs tablet       TAKE these medications    methocarbamol 500 MG tablet Commonly known as: ROBAXIN Take 1 tablet (500 mg total) by mouth every 6 (six) hours as needed for muscle spasms.   omeprazole 20 MG capsule Commonly known as: PRILOSEC TAKE 1 CAPSULE BY MOUTH EVERY DAY What changed:  how much to take how to take this when to take this additional instructions   oxyCODONE 5 MG immediate release tablet Commonly known as: Oxy IR/ROXICODONE Take 1-2 tablets (5-10 mg total) by mouth every 6 (six) hours as needed for severe pain.   PAPAV-PHENTOLAMINE-ALPROSTADIL IC 1 Dose by Intracavernosal route daily as needed (sexual intercourse). Trimix Injection   rivaroxaban 10 MG Tabs tablet Commonly known as: XARELTO Take 1 tablet (10 mg total) by mouth daily with breakfast.   traMADol 50 MG  tablet Commonly known as: ULTRAM Take 1-2 tablets (50-100 mg total) by mouth every 6 (six) hours as needed for moderate pain.   valsartan-hydrochlorothiazide 160-12.5 MG tablet Commonly known as: DIOVAN-HCT Take 1 tablet by mouth in the morning.               Discharge Care Instructions  (From admission, onward)           Start     Ordered   12/02/20 0000  Weight bearing as tolerated        12/02/20 0725   12/02/20 0000  Change dressing       Comments: You may remove the bulky bandage (ACE wrap and gauze) two days after surgery. You will have an adhesive waterproof bandage underneath. Leave this in place until your first follow-up appointment.   12/02/20 0725            Follow-up Information     Gaynelle Arabian, MD. Schedule an appointment as soon as possible for a visit on 12/14/2020.   Specialty: Orthopedic Surgery Contact information: [address] [place] [address] [phone]  Signed: Fenton Foy, MBA, PA-C Orthopedic Surgery 12/20/2020, 7:26 AM

## 2021-04-23 IMAGING — DX DG KNEE 1-2V PORT*R*
2 series · 2 of 2 positions shown · non-contrast
Comparison: None.

CLINICAL DATA: Status post right knee replacement

EXAM:
PORTABLE RIGHT KNEE - 1-2 VIEW

[knee lat]
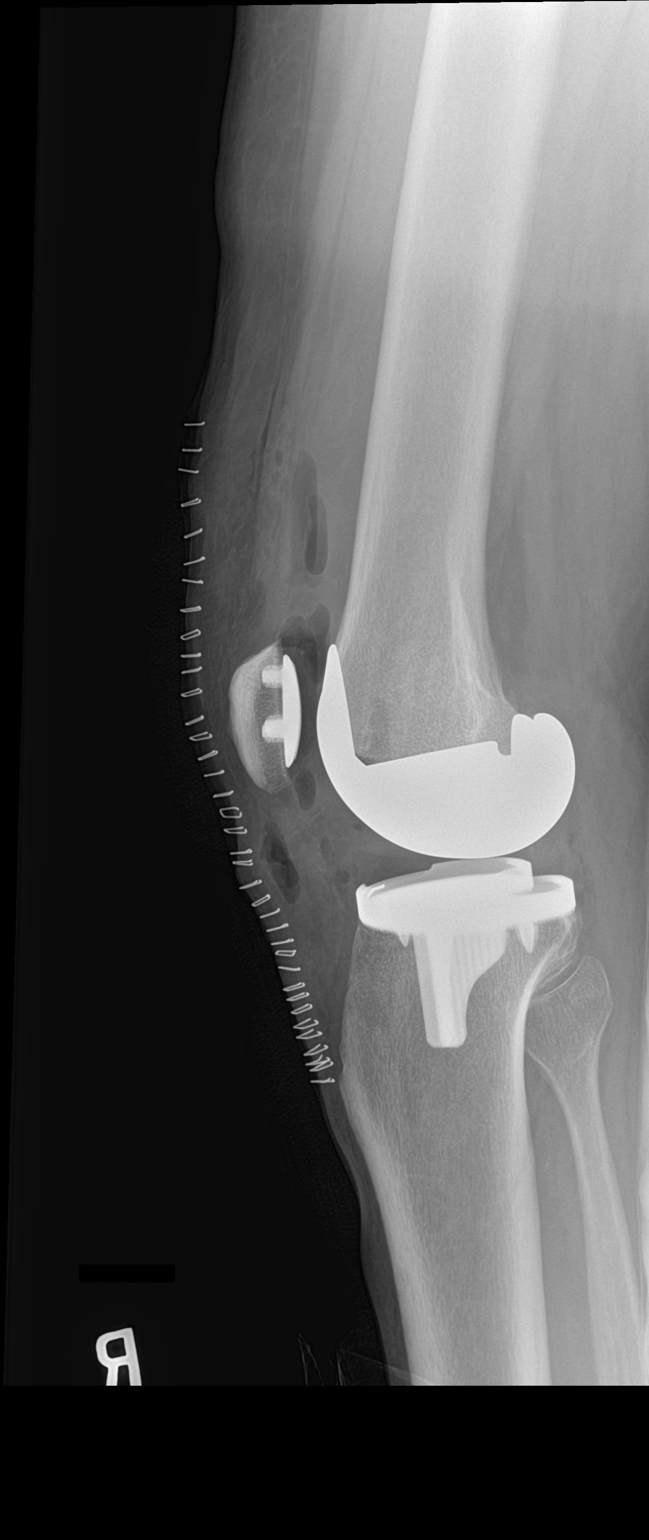

[knee ap]
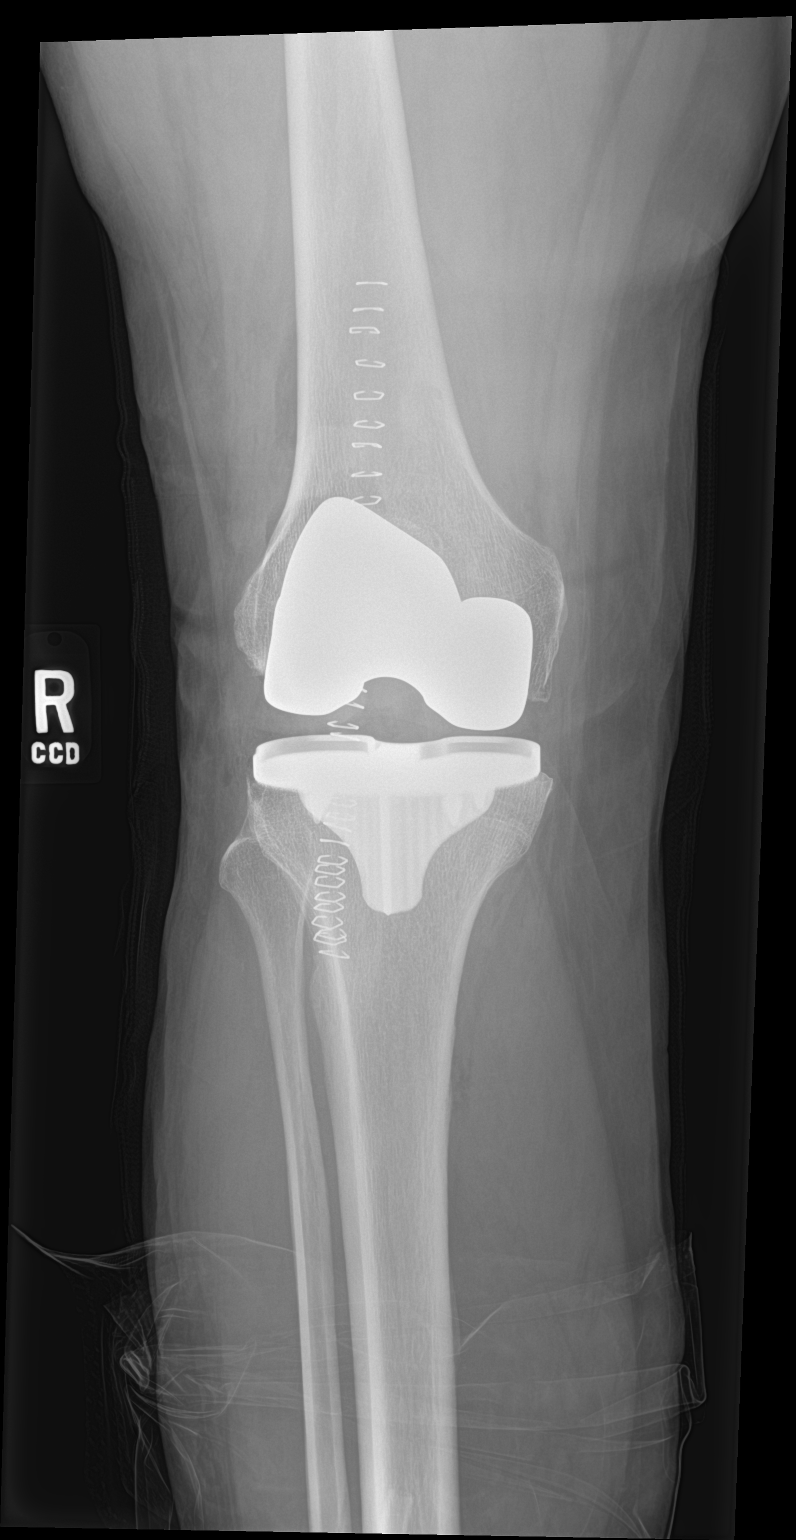

[2 of 2 positions shown; findings below may reference images not displayed]

FINDINGS: Right knee prosthesis is noted in satisfactory position. No acute
fracture is noted. No soft tissue abnormality is seen.
IMPRESSION: Status post right knee replacement

## 2021-06-03 ENCOUNTER — Other Ambulatory Visit: Payer: Self-pay | Admitting: Internal Medicine

## 2021-06-08 HISTORY — PX: TOTAL HIP ARTHROPLASTY: SHX124

## 2021-08-08 ENCOUNTER — Other Ambulatory Visit: Payer: Self-pay | Admitting: Internal Medicine

## 2021-10-08 HISTORY — PX: TOTAL KNEE ARTHROPLASTY: SHX125

## 2021-11-19 ENCOUNTER — Other Ambulatory Visit: Payer: Self-pay | Admitting: Internal Medicine

## 2022-01-19 ENCOUNTER — Encounter: Payer: Self-pay | Admitting: Internal Medicine

## 2022-02-09 ENCOUNTER — Telehealth: Payer: Self-pay

## 2022-02-09 NOTE — Telephone Encounter (Signed)
Left message on VM for patient to call back to the office-  Needing to find out if patient is still taking Xarelto (blood thinner) prior to Pre Visit appt; If patient is still taking Xarelto-a request needs to be sent to [facility] for a hold from the prescribing MD- Notation for this message placed in black book in [facility] office

## 2022-02-10 NOTE — Telephone Encounter (Signed)
Called and spoke with patient - patient reports he is no longer taking Xarelto- he was only taking because of surgery that was done in July of 2023-patient last dose was prior to 11/2021  Noted on PV chart-

## 2022-02-13 ENCOUNTER — Ambulatory Visit (AMBULATORY_SURGERY_CENTER): Payer: Self-pay

## 2022-02-13 VITALS — Ht 73.0 in | Wt 227.0 lb

## 2022-02-13 DIAGNOSIS — Z1211 Encounter for screening for malignant neoplasm of colon: Secondary | ICD-10-CM

## 2022-02-13 MED ORDER — NA SULFATE-K SULFATE-MG SULF 17.5-3.13-1.6 GM/177ML PO SOLN: 1.0000 | Freq: Once | ORAL | 0 refills | Status: AC

## 2022-02-13 NOTE — Progress Notes (Signed)
No egg or soy allergy known to patient  No issues known to pt with past sedation with any surgeries or procedures; Patient denies ever being told they had issues or difficulty with intubation;  No FH of Malignant Hyperthermia; Pt is not on diet pills; Pt is not on home 02;  Pt is not on blood thinners;  Pt denies issues with constipation;  No A fib or A flutter Have any cardiac testing pending--NO Pt instructed to use Singlecare.com or GoodRx for a price reduction on prep   Insurance verified during [place] appt=[employer]  Patient's chart reviewed by Osvaldo Angst CNRA prior to previsit and patient appropriate for the [place].  Previsit completed and red dot placed by patient's name on their procedure day (on provider's schedule).

## 2022-02-21 ENCOUNTER — Other Ambulatory Visit: Payer: Self-pay | Admitting: Internal Medicine

## 2022-02-21 NOTE — Telephone Encounter (Signed)
Please advise 

## 2022-02-24 IMAGING — NM NM BONE 3 PHASE
7 series · 17 of 17 positions shown · non-contrast
Comparison: None

Radiographic correlation: 04/07/2020

CLINICAL DATA: Post RIGHT knee arthroplasty November 2019, feeling of
instability with ambulation

EXAM:
NUCLEAR MEDICINE 3-PHASE BONE SCAN
TECHNIQUE: Radionuclide angiographic images, immediate static blood pool
images, and 3-hour delayed static images were obtained of the knees
after intravenous injection of radiopharmaceutical.
RADIOPHARMACEUTICALS:  21.7 mCi Rc-88m MDP IV

[Series 1: flow · 2.07mm/px · 6 of 48 frames shown (1 of 2)]
[frame 5/48]
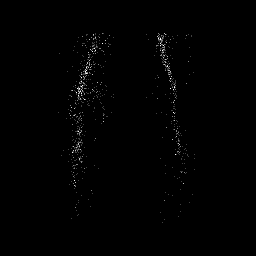
[frame 13/48  full-range]
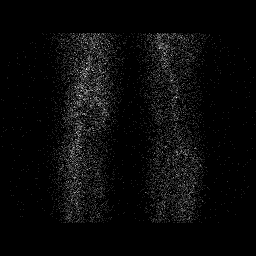
[frame 21/48  full-range]
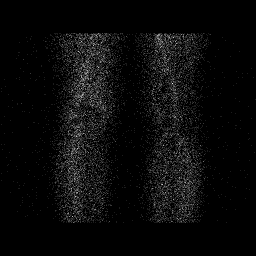
[frame 29/48  full-range]
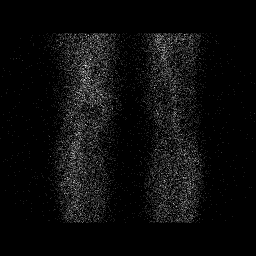
[frame 37/48  full-range]
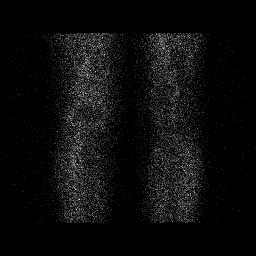
[frame 45/48  full-range]
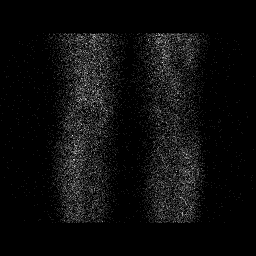

[Series 1: flow · 2.07mm/px · 6 of 48 frames shown (2 of 2)]
[frame 5/48]
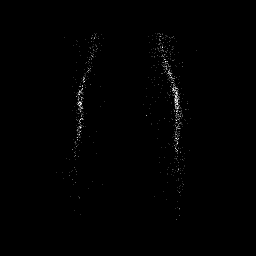
[frame 13/48  full-range]
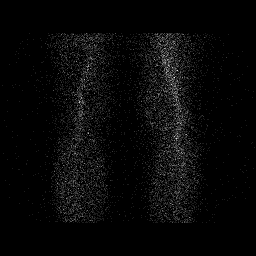
[frame 21/48  full-range]
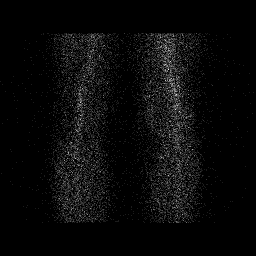
[frame 29/48  full-range]
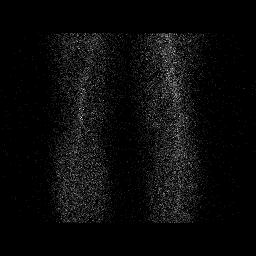
[frame 37/48  full-range]
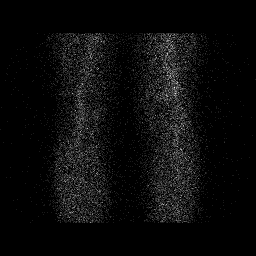
[frame 45/48  full-range]
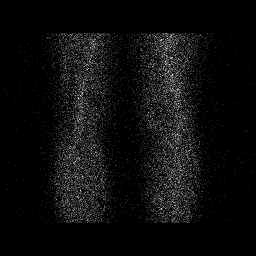

[Series 2: blood pool · 2.07mm/px · 1 of 1 slices shown (1 of 2)]
[im 1/1  full-range]
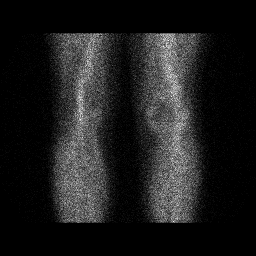

[Series 2: blood pool · 2.07mm/px · 1 of 1 slices shown (2 of 2)]
[im 1/1  full-range]
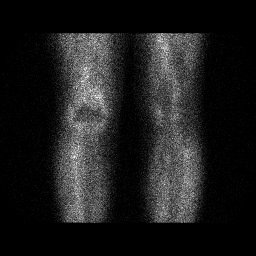

[Series 3: lat bp · 2.07mm/px · 1 of 1 slices shown (1 of 2)]
[im 1/1  full-range]
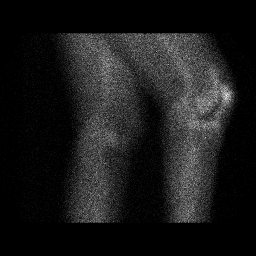

[Series 3: lat bp · 2.07mm/px · 1 of 1 slices shown (2 of 2)]
[im 1/1  full-range]
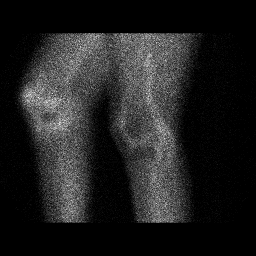

[Series 4: delay · delayed · 2.07mm/px · 1 of 1 slices shown]
[im 1/1]
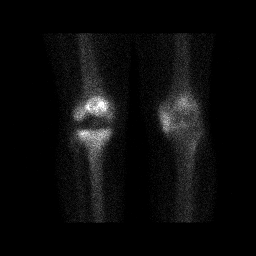

[17 of 17 positions shown; findings below may reference images not displayed]

FINDINGS: Vascular phase: Increased blood flow to periarticular regions of
RIGHT knee

Blood pool phase: Increased blood pool surrounding RIGHT knee.
Minimally increased blood pool at medial compartment LEFT knee.

Delayed phase: Uptake at medial compartment of LEFT knee consistent
with degenerative changes as seen on radiographs. Photopenic defect
at RIGHT knee from knee prosthesis. Diffuse uptake of tracer is seen
adjacent to components of the RIGHT knee prosthesis, greatest at
medial tibial plateau. No additional abnormal tracer localization is
seen.
IMPRESSION: Degenerative type uptake of tracer at LEFT knee.

Increased blood flow and blood pool of tracer surrounding RIGHT knee
joint consistent with hyperemia question synovitis.

RIGHT knee prosthesis with diffuse uptake of tracer surrounding the
prosthesis, minimally greater at the medial, favor representing
postoperative uptake since the uptake is seen fairly diffusely
adjacent to the prosthetic components.

No definite scintigraphic evidence of aseptic loosening of the RIGHT
knee prosthesis.

## 2022-03-13 ENCOUNTER — Encounter: Payer: Self-pay | Admitting: Internal Medicine

## 2022-03-16 ENCOUNTER — Encounter: Payer: Self-pay | Admitting: Internal Medicine

## 2022-03-16 ENCOUNTER — Ambulatory Visit (AMBULATORY_SURGERY_CENTER): Payer: 59 | Admitting: Internal Medicine

## 2022-03-16 VITALS — BP 103/55 | HR 72 | Temp 98.1°F | Resp 14 | Ht 73.0 in | Wt 227.0 lb

## 2022-03-16 DIAGNOSIS — Z1211 Encounter for screening for malignant neoplasm of colon: Secondary | ICD-10-CM

## 2022-03-16 DIAGNOSIS — K635 Polyp of colon: Secondary | ICD-10-CM

## 2022-03-16 DIAGNOSIS — D122 Benign neoplasm of ascending colon: Secondary | ICD-10-CM

## 2022-03-16 DIAGNOSIS — D123 Benign neoplasm of transverse colon: Secondary | ICD-10-CM

## 2022-03-16 MED ORDER — SODIUM CHLORIDE 0.9 % IV SOLN: 500.0000 mL | Freq: Once | INTRAVENOUS | Status: AC

## 2022-03-16 MED ORDER — OMEPRAZOLE 20 MG PO CPDR: 20.0000 mg | DELAYED_RELEASE_CAPSULE | Freq: Every day | ORAL | 3 refills | Status: DC

## 2022-03-16 NOTE — Progress Notes (Signed)
HISTORY OF PRESENT ILLNESS:  Brandon Franco is a 61 y.o. male who presents for screening colonoscopy.  Previous examination 2013 was negative for neoplasia.  No active complaints.  REVIEW OF SYSTEMS:  All non-GI ROS negative except for  Past Medical History:  Diagnosis Date   Arthritis    bilateral knees   Basal cell carcinoma 04/28/2020   right ear   Chronic kidney disease    kidney stone   GERD (gastroesophageal reflux disease)    on meds   History of kidney stones    Hypertension    on meds   Prostate cancer ([place]) 2016   prostate   Right bundle branch block    dx as a child   Sleep apnea    uses CPAP    Past Surgical History:  Procedure Laterality Date   APPENDECTOMY     COLONOSCOPY  2013   JP-MAC-movi(exc)-tics/normal-1yrrecall   ISedaliainguinal hernia- pt does not remember R or L   MOHS SURGERY Right 04/28/2020   right ear   PROSTATECTOMY  2016   SHOULDER ARTHROSCOPY Right    TOTAL HIP ARTHROPLASTY  04/26/2012   Procedure: TOTAL HIP ARTHROPLASTY ANTERIOR APPROACH;  Surgeon: CMcarthur Rossetti MD;  Location: [facility];  Service: Orthopedics;  Laterality: Right;  Right Total Hip Arthroplasty   TOTAL HIP ARTHROPLASTY Left 06/2021   TOTAL KNEE ARTHROPLASTY Right 11/14/2019   Procedure: RIGHT TOTAL KNEE ARTHROPLASTY;  Surgeon: BMcarthur Rossetti MD;  Location: [facility];  Service: Orthopedics;  Laterality: Right;   TOTAL KNEE ARTHROPLASTY  10/2021   TOTAL KNEE REVISION Right 12/01/2020   Procedure: Rt knee polyethylene vs total knee arthroplasty revision;  Surgeon: AGaynelle Arabian MD;  Location: [facility];  Service: Orthopedics;  Laterality: Right;   WISDOM TOOTH EXTRACTION      Social History Brandon Franco reports that he has never smoked. He has never used smokeless tobacco. He reports that he does not currently use alcohol. He reports that he does not use drugs.  family history includes Colon cancer in his paternal  uncle; Colon polyps (age of onset: 72 in his father; Heart disease in his mother; Hypertension in his father.  Allergies  Allergen Reactions   Lisinopril Cough       PHYSICAL EXAMINATION: Vital signs: BP 114/69   Pulse 65   Temp 98.1 F (36.7 C)   Resp 19   Ht _0  (1.854 m)   Wt 227 lb (103 kg)   SpO2 99%   BMI 29.95 kg/m  General: Well-developed, well-nourished, no acute distress HEENT: Sclerae are anicteric, conjunctiva pink. Oral mucosa intact Lungs: Clear Heart: Regular Abdomen: soft, nontender, nondistended, no obvious ascites, no peritoneal signs, normal bowel sounds. No organomegaly. Extremities: No edema Psychiatric: alert and oriented x3. Cooperative     ASSESSMENT:  Colon cancer screening   PLAN:   Screening colonoscopy

## 2022-03-16 NOTE — Progress Notes (Signed)
Report to PACU, RN, vss, BBS= Clear.  

## 2022-03-16 NOTE — Patient Instructions (Signed)
   Handout on polyps given to you today  Await pathology results on polyps removed    YOU HAD AN ENDOSCOPIC PROCEDURE TODAY AT [facility]:   Refer to the procedure report that was given to you for any specific questions about what was found during the examination.  If the procedure report does not answer your questions, please call your gastroenterologist to clarify.  If you requested that your care partner not be given the details of your procedure findings, then the procedure report has been included in a sealed envelope for you to review at your convenience later.  YOU SHOULD EXPECT: Some feelings of bloating in the abdomen. Passage of more gas than usual.  Walking can help get rid of the air that was put into your GI tract during the procedure and reduce the bloating. If you had a lower endoscopy (such as a colonoscopy or flexible sigmoidoscopy) you may notice spotting of blood in your stool or on the toilet paper. If you underwent a bowel prep for your procedure, you may not have a normal bowel movement for a few days.  Please Note:  You might notice some irritation and congestion in your nose or some drainage.  This is from the oxygen used during your procedure.  There is no need for concern and it should clear up in a day or so.  SYMPTOMS TO REPORT IMMEDIATELY:  Following lower endoscopy (colonoscopy or flexible sigmoidoscopy):  Excessive amounts of blood in the stool  Significant tenderness or worsening of abdominal pains  Swelling of the abdomen that is new, acute  Fever of 100F or higher   For urgent or emergent issues, a gastroenterologist can be reached at any hour by calling [phone]. Do not use MyChart messaging for urgent concerns.    DIET:  We do recommend a small meal at first, but then you may proceed to your regular diet.  Drink plenty of fluids but you should avoid alcoholic beverages for 24 hours.  ACTIVITY:  You should plan to take it easy for  the rest of today and you should NOT DRIVE or use heavy machinery until tomorrow (because of the sedation medicines used during the test).    FOLLOW UP: Our staff will call the number listed on your records the next business day following your procedure.  We will call around 7:15- 8:00 am to check on you and address any questions or concerns that you may have regarding the information given to you following your procedure. If we do not reach you, we will leave a message.     If any biopsies were taken you will be contacted by phone or by letter within the next 1-3 weeks.  Please call us at [phone] if you have not heard about the biopsies in 3 weeks.    SIGNATURES/CONFIDENTIALITY: You and/or your care partner have signed paperwork which will be entered into your electronic medical record.  These signatures attest to the fact that that the information above on your After Visit Summary has been reviewed and is understood.  Full responsibility of the confidentiality of this discharge information lies with you and/or your care-partner.

## 2022-03-16 NOTE — Op Note (Signed)
[facility] Patient Name: Brandon Franco Procedure Date: 03/16/2022 8:47 AM MRN: [id number] Endoscopist: Docia Chuck. Henrene Pastor , MD, [id number] Age: 61 Referring MD:  Date of Birth: [birth year] Gender: Male Account #: [id number] Procedure:                Colonoscopy with cold snare polypectomy x 2 Indications:              Screening for colorectal malignant neoplasm.                            Negative exam in [place], here 2013 Medicines:                Monitored Anesthesia Care Procedure:                Pre-Anesthesia Assessment:                           - Prior to the procedure, a History and Physical                            was performed, and patient medications and                            allergies were reviewed. The patient's tolerance of                            previous anesthesia was also reviewed. The risks                            and benefits of the procedure and the sedation                            options and risks were discussed with the patient.                            All questions were answered, and informed consent                            was obtained. Prior Anticoagulants: The patient has                            taken no anticoagulant or antiplatelet agents. ASA                            Grade Assessment: II - A patient with mild systemic                            disease. After reviewing the risks and benefits,                            the patient was deemed in satisfactory condition to                            undergo the procedure.  After obtaining informed consent, the colonoscope                            was passed under direct vision. Throughout the                            procedure, the patient's blood pressure, pulse, and                            oxygen saturations were monitored continuously. The                            CF [id number] #[id number] was introduced through the anus                             and advanced to the the cecum, identified by                            appendiceal orifice and ileocecal valve. The                            ileocecal valve, appendiceal orifice, and rectum                            were photographed. The quality of the bowel                            preparation was excellent. The colonoscopy was                            performed without difficulty. The patient tolerated                            the procedure well. The bowel preparation used was                            SUPREP via split dose instruction. Scope In: 9:02:16 AM Scope Out: 9:19:43 AM Scope Withdrawal Time: 0 hours 11 minutes 59 seconds  Total Procedure Duration: 0 hours 17 minutes 27 seconds  Findings:                 Two polyps were found in the transverse colon and                            ascending colon. The polyps were 2 to 3 mm in size.                            These polyps were removed with a cold snare.                            Resection and retrieval were complete.                           The exam was otherwise  without abnormality on                            direct and retroflexion views. Complications:            No immediate complications. Estimated blood loss:                            None. Estimated Blood Loss:     Estimated blood loss: none. Impression:               - Two 2 to 3 mm polyps in the transverse colon and                            in the ascending colon, removed with a cold snare.                            Resected and retrieved.                           - The examination was otherwise normal on direct                            and retroflexion views. Recommendation:           - Repeat colonoscopy in 7-10 years for surveillance.                           - Patient has a contact number available for                            emergencies. The signs and symptoms of potential                            delayed complications were  discussed with the                            patient. Return to normal activities tomorrow.                            Written discharge instructions were provided to the                            patient.                           - Resume previous diet.                           - Continue present medications.                           - Await pathology results. Docia Chuck. Henrene Pastor, MD 03/16/2022 9:28:08 AM This report has been signed electronically.

## 2022-03-16 NOTE — Progress Notes (Signed)
Called to room to assist during endoscopic procedure.  Patient ID and intended procedure confirmed with present staff. Received instructions for my participation in the procedure from the performing physician.  

## 2022-03-17 ENCOUNTER — Telehealth: Payer: Self-pay

## 2022-03-17 NOTE — Telephone Encounter (Signed)
First post procedure follow up call, no answer 

## 2022-03-21 ENCOUNTER — Encounter: Payer: Self-pay | Admitting: Internal Medicine

## 2023-06-05 ENCOUNTER — Other Ambulatory Visit: Payer: Self-pay | Admitting: Internal Medicine

## 2023-08-28 ENCOUNTER — Other Ambulatory Visit: Payer: Self-pay | Admitting: Internal Medicine

## 2023-11-05 ENCOUNTER — Encounter: Payer: Self-pay | Admitting: Internal Medicine

## 2023-11-05 ENCOUNTER — Ambulatory Visit (INDEPENDENT_AMBULATORY_CARE_PROVIDER_SITE_OTHER): Admitting: Internal Medicine

## 2023-11-05 VITALS — BP 136/80 | HR 62 | Ht 73.0 in | Wt 229.0 lb

## 2023-11-05 DIAGNOSIS — Z860101 Personal history of adenomatous and serrated colon polyps: Secondary | ICD-10-CM

## 2023-11-05 DIAGNOSIS — K219 Gastro-esophageal reflux disease without esophagitis: Secondary | ICD-10-CM

## 2023-11-05 DIAGNOSIS — Z860100 Personal history of colon polyps, unspecified: Secondary | ICD-10-CM

## 2023-11-05 MED ORDER — OMEPRAZOLE 20 MG PO CPDR: 20.0000 mg | DELAYED_RELEASE_CAPSULE | Freq: Every day | ORAL | 3 refills | Status: AC

## 2023-11-05 NOTE — Progress Notes (Signed)
 HISTORY OF PRESENT ILLNESS:  Brandon Franco is a 63 y.o. male, retired Programme researcher, broadcasting/film/video who enjoys tennis, Programmer, applications, and gardening.  He has a history of GERD for which he is on PPI therapy.  He presents today for follow-up regarding chronic GERD, questions regarding PPI safety, and medication refill.  The patient was last seen in this office January 2022.  At that time he was doing well on omeprazole  20 mg daily.  As long as he takes the medication, he continues to do well.  He had questions regarding reports possibly linking PPI therapy to dementia.  He denies dysphagia.  Previous upper endoscopy in 2017 was normal.  He also has a history of adenomatous colon polyps.  Last colonoscopy December 2023.  Found to have a diminutive adenoma and hyperplastic polyp.  Follow-up in 7 years recommended.  No lower GI complaints.  He has been off Ozempic.  Has had significant weight loss since his last visit (35 pounds).  REVIEW OF SYSTEMS:  All non-GI ROS negative. Past Medical History:  Diagnosis Date   Arthritis    bilateral knees   Basal cell carcinoma 04/28/2020   right ear   Chronic kidney disease    kidney stone   GERD (gastroesophageal reflux disease)    on meds   History of kidney stones    Hypertension    on meds   Prostate cancer (HCC) 2016   prostate   Right bundle branch block    dx as a child   Sleep apnea    uses CPAP    Past Surgical History:  Procedure Laterality Date   APPENDECTOMY     COLONOSCOPY  2013   JP-MAC-movi(exc)-tics/normal-86yr recall   INGUINAL HERNIA REPAIR  1996   laproscopy inguinal hernia- pt does not remember R or L   MOHS SURGERY Right 04/28/2020   right ear   PROSTATECTOMY  2016   SHOULDER ARTHROSCOPY Right    TOTAL HIP ARTHROPLASTY  04/26/2012   Procedure: TOTAL HIP ARTHROPLASTY ANTERIOR APPROACH;  Surgeon: Lonni CINDERELLA Poli, MD;  Location: [facility];  Service: Orthopedics;  Laterality: Right;  Right Total Hip Arthroplasty   TOTAL HIP  ARTHROPLASTY Left 06/2021   TOTAL KNEE ARTHROPLASTY Right 11/14/2019   Procedure: RIGHT TOTAL KNEE ARTHROPLASTY;  Surgeon: Poli Lonni CINDERELLA, MD;  Location: [facility];  Service: Orthopedics;  Laterality: Right;   TOTAL KNEE ARTHROPLASTY  10/2021   TOTAL KNEE REVISION Right 12/01/2020   Procedure: Rt knee polyethylene vs total knee arthroplasty revision;  Surgeon: Melodi Lerner, MD;  Location: [facility];  Service: Orthopedics;  Laterality: Right;   WISDOM TOOTH EXTRACTION      Social History Brandon Franco  reports that he has never smoked. He has never used smokeless tobacco. He reports that he does not currently use alcohol. He reports that he does not use drugs.  family history includes Colon cancer in his paternal uncle; Colon polyps (age of onset: 10) in his father; Heart disease in his mother; Hypertension in his father.  Allergies  Allergen Reactions   Lisinopril Cough       PHYSICAL EXAMINATION: Vital signs: BP 136/80   Pulse 62   Ht 6' 1 (1.854 m)   Wt 229 lb (103.9 kg)   BMI 30.21 kg/m   Constitutional: generally well-appearing, no acute distress Psychiatric: alert and oriented x3, cooperative Eyes: extraocular movements intact, anicteric, conjunctiva pink Mouth: oral pharynx moist, no lesions Neck: supple no lymphadenopathy Cardiovascular: heart regular rate and rhythm, no murmur Lungs:  clear to auscultation bilaterally Abdomen: soft, nontender, nondistended, no obvious ascites, no peritoneal signs, normal bowel sounds, no organomegaly Rectal: Omitted Extremities: no clubbing, cyanosis, or lower extremity edema bilaterally.  Surgical scars on the knees bilaterally Skin: no relevant lesions on visible extremities Neuro: No focal deficits.  Cranial nerves intact  ASSESSMENT:  1.  GERD.  Uncomplicated.  Well-controlled with low-dose PPI 2.  History of adenomatous colon polyp December 2023   PLAN:  1.  Reflux precautions 2.  Refill omeprazole  20 mg daily.   Medication risks reviewed. 3.  Routine office follow-up 2 years 4.  Adenomatous colon polyp surveillance colonoscopy around December 2030

## 2023-11-05 NOTE — Patient Instructions (Signed)
 We have sent the following medications to your pharmacy for you to pick up at your convenience:  Omeprazole   _______________________________________________________  If your blood pressure at your visit was 140/90 or greater, please contact your primary care physician to follow up on this.  _______________________________________________________  If you are age 62 or older, your body mass index should be between 23-30. Your Body mass index is 30.21 kg/m. If this is out of the aforementioned range listed, please consider follow up with your Primary Care Provider.  If you are age 22 or younger, your body mass index should be between 19-25. Your Body mass index is 30.21 kg/m. If this is out of the aformentioned range listed, please consider follow up with your Primary Care Provider.   ________________________________________________________  The [facility] providers would like to encourage you to use MYCHART to communicate with providers for non-urgent requests or questions.  Due to long hold times on the telephone, sending your provider a message by [facility] may be a faster and more efficient way to get a response.  Please allow 48 business hours for a response.  Please remember that this is for non-urgent requests.  _______________________________________________________  [facility] is using a team-based approach to care.  Your team is made up of your doctor and two to three APPS. Our APPS (Nurse Practitioners and Physician Assistants) work with your physician to ensure care continuity for you. They are fully qualified to address your health concerns and develop a treatment plan. They communicate directly with your gastroenterologist to care for you. Seeing the Advanced Practice Practitioners on your physician's team can help you by facilitating care more promptly, often allowing for earlier appointments, access to diagnostic testing, procedures, and other specialty referrals.

## 2023-11-26 ENCOUNTER — Other Ambulatory Visit (HOSPITAL_COMMUNITY): Payer: Self-pay
